# Patient Record
Sex: Male | Born: 1945 | Race: White | Hispanic: No | Marital: Married | Smoking: Former smoker
Health system: Southern US, Community
[De-identification: ages and names within clinical notes are randomized; demographics above are authoritative.]

## PROBLEM LIST (undated history)

## (undated) DIAGNOSIS — R112 Nausea with vomiting, unspecified: Secondary | ICD-10-CM

## (undated) DIAGNOSIS — S060X9A Concussion with loss of consciousness of unspecified duration, initial encounter: Secondary | ICD-10-CM

## (undated) DIAGNOSIS — D649 Anemia, unspecified: Secondary | ICD-10-CM

## (undated) DIAGNOSIS — I2510 Atherosclerotic heart disease of native coronary artery without angina pectoris: Secondary | ICD-10-CM

## (undated) DIAGNOSIS — S060XAA Concussion with loss of consciousness status unknown, initial encounter: Secondary | ICD-10-CM

## (undated) DIAGNOSIS — M5412 Radiculopathy, cervical region: Secondary | ICD-10-CM

## (undated) DIAGNOSIS — E1140 Type 2 diabetes mellitus with diabetic neuropathy, unspecified: Secondary | ICD-10-CM

## (undated) DIAGNOSIS — I10 Essential (primary) hypertension: Secondary | ICD-10-CM

## (undated) DIAGNOSIS — C801 Malignant (primary) neoplasm, unspecified: Secondary | ICD-10-CM

## (undated) DIAGNOSIS — Z87442 Personal history of urinary calculi: Secondary | ICD-10-CM

## (undated) DIAGNOSIS — K56609 Unspecified intestinal obstruction, unspecified as to partial versus complete obstruction: Secondary | ICD-10-CM

## (undated) DIAGNOSIS — N400 Enlarged prostate without lower urinary tract symptoms: Secondary | ICD-10-CM

## (undated) DIAGNOSIS — K259 Gastric ulcer, unspecified as acute or chronic, without hemorrhage or perforation: Secondary | ICD-10-CM

## (undated) DIAGNOSIS — E119 Type 2 diabetes mellitus without complications: Secondary | ICD-10-CM

## (undated) DIAGNOSIS — F4310 Post-traumatic stress disorder, unspecified: Secondary | ICD-10-CM

## (undated) DIAGNOSIS — E785 Hyperlipidemia, unspecified: Secondary | ICD-10-CM

## (undated) HISTORY — PX: JOINT REPLACEMENT: SHX530

## (undated) HISTORY — PX: ABDOMINAL SURGERY: SHX537

## (undated) HISTORY — PX: MENISCUS REPAIR: SHX5179

## (undated) HISTORY — PX: BACK SURGERY: SHX140

## (undated) HISTORY — PX: OTHER SURGICAL HISTORY: SHX169

## (undated) HISTORY — PX: BILATERAL CARPAL TUNNEL RELEASE: SHX6508

## (undated) HISTORY — PX: CARDIAC CATHETERIZATION: SHX172

## (undated) HISTORY — PX: TONSILLECTOMY: SUR1361

## (undated) HISTORY — PX: CORONARY STENT PLACEMENT: SHX6853

---

## 2004-04-23 ENCOUNTER — Inpatient Hospital Stay (HOSPITAL_COMMUNITY): Admission: RE | Admit: 2004-04-23 | Discharge: 2004-04-26 | Payer: Self-pay | Admitting: Orthopedic Surgery

## 2007-08-17 ENCOUNTER — Inpatient Hospital Stay (HOSPITAL_COMMUNITY): Admission: RE | Admit: 2007-08-17 | Discharge: 2007-08-20 | Payer: Self-pay | Admitting: Orthopedic Surgery

## 2010-06-12 NOTE — Op Note (Signed)
NAMEALVIS, EDGELL                ACCOUNT NO.:  [id number]   MEDICAL RECORD NO.:  [id number]          PATIENT TYPE:  INP   LOCATION:  0006                         FACILITY:  [facility]   PHYSICIAN:  Ollen Gross, M.D.    DATE OF BIRTH:  [birth year]   DATE OF PROCEDURE:  08/17/2007  DATE OF DISCHARGE:                               OPERATIVE REPORT   PREOPERATIVE DIAGNOSIS:  Osteoarthritis, right hip.   POSTOPERATIVE DIAGNOSIS:  Osteoarthritis, right hip.   PROCEDURE:  Right total hip arthroplasty.   SURGEON:  Aluisio   ASSISTANT:  Randel Pigg, PA-C   ANESTHESIA:  General.   ESTIMATED BLOOD LOSS:  500.   DRAIN:  Hemovac x 1.   COMPLICATIONS:  None.   CONDITION:  Stable to recovery.   BRIEF CLINICAL NOTE:  [name] is a 65 year old male who has end-stage  osteoarthritis of the right hip with progressively worsening pain and  dysfunction.  He has had a previous successful left total hip  arthroplasty, presents now for right total hip arthroplasty.   PROCEDURE IN DETAIL:  After the successful administration of general  anesthetic, the patient is placed in the left lateral decubitus position  with the right side up and held with the hip positioner.  The right  lower extremity is isolated from his perineum with plastic drapes and  prepped and draped in the usual sterile fashion.  Short posterolateral  incision is made with a 10 blade through subcutaneous tissue to the  level of the fascia lata which is incised in line with the skin  incision.  Sciatic nerve is palpated and protected and the short  external rotators isolated off the femur.  Capsulectomy is performed,  and the hip is dislocated.  Center of the femoral head is marked and a  trial prosthesis placed such that the center of the trial head  corresponds to the center of his native femoral head.  Osteotomy line is  marked on the femoral neck, and osteotomy is made with an oscillating  saw.  Femoral head is them removed, and the femur  is retracted  anteriorly to gain acetabular exposure.   Acetabular retractors are placed and labrum and osteophytes removed.  Reaming starts at 47 mm, coursing in increments of 2 to 53 mm.  Then a  54 mm Pinnacle acetabular shell is placed in anatomic position and  transfixed with 2 dome screws.  The apex hole eliminator is then placed,  and the permanent 36 mm neutral Ultamet metal liner is placed for metal-  on-metal hip replacement.   Femur is prepared with the canal finder and irrigation.  Axial remaining  is performed to 13.5 mm, proximal reaming to an 18D, and the sleeve  machined to a small.  An 18D small trial sleeve is placed with an 18 x  13 stem, a 36 plus 8 neck, matching native anteversion.  A 36 plus head  is placed.  With the plus head, there was too much offset, so we  impacted a 36 standard neck, matching native anteversion.  With the 36  plus 0 head, and the  hip is reduced with excellent stability.  There was  full extension, full external rotation, 70 degrees flexion, 40 degrees  adduction, 90 degrees internal rotation, and 90 degrees of flexion, 70  degrees of internal rotation.  By placing the right leg on top of the  left, it is felt as though leg lengths are equal.  The hip is then  dislocated and trials removed.  Permanent 18D small sleeve is placed, an  18 x 13 stem, and a 36 standard neck, matching native anteversion.  Then  36 plus 0 head is placed, and the his is reduced with the same stability  parameters.  The wound is copiously irrigated with saline solution, the  short rotators reattached to the femur through drill holes.  Fascia lata  is closed over a Hemovac drain with interrupted #1 Vicryl, subcu closed  with #1 and 2-0 Vicryl, and subcuticular with running 4-0 Monocryl.  The  incision is cleaned and dried and Steri-Strips and a bulky sterile  dressing applied. The drain is hooked to suction, and is placed into a  knee immobilizer, awakened, and  transported to recovery in stable  condition.      Ollen Gross, M.D.  Electronically Signed     FA/MEDQ  D:  08/17/2007  T:  08/17/2007  Job:  [id number]

## 2010-06-15 NOTE — H&P (Signed)
[name], [name]                ACCOUNT NO.:  [id number]   MEDICAL RECORD NO.:  [id number]          PATIENT TYPE:  INP   LOCATION:  NA                           FACILITY:  [facility]   PHYSICIAN:  Ollen Gross, M.D.    DATE OF BIRTH:  [birth year]   DATE OF ADMISSION:  04/23/2004  DATE OF DISCHARGE:                                HISTORY & PHYSICAL   CHIEF COMPLAINT:  Left hip pain.   HISTORY OF PRESENT ILLNESS:  Mr. Spitler is a 65 year old male with a 6-8  month progressive history of left hip pain.  It started in the hip area and  radiated down towards the knee.  He injured himself about a year and a half  ago in [place] while he was fishing and standing on a rock.  He had a lot of  swelling laterally at the time, but he was told that he did not have any  fractures.  He followed up with Dr. Thamas Jaegers at [facility] last  summer and was told that he had severe arthritis and needed a hip  replacement.  His symptoms have been getting progressively worse.  He has  constant pain, even occurring at night.  His x-ray shows severe joint space  narrowing of the left hip with bone-on-bone changes.  It is felt he would  benefit from surgical intervention.  Risks and benefits discussed.  Patient  was subsequently admitted to the hospital.   ALLERGIES:  No known drug allergies.   CURRENT MEDICATIONS:  1.  Lisinopril 2 mg daily.  2.  Felodipine 5 mg daily.  3.  Darvocet as needed.   PAST MEDICAL HISTORY:  1.  Hypertension.  2.  History of gastric ulcers.  3.  History of renal calculi.  4.  History of wrist fracture.   PAST SURGICAL HISTORY:  1.  Surgery for a head injury in [place] in 1970.  2.  Gastric surgery for ulcers in 1990.  3.  Back surgery, diskectomy in 1995.  4.  Knee surgery in 2003.  5.  Kidney stone surgery in 2005.  6.  Left carpal tunnel in 2000.   SOCIAL HISTORY:  Married.  Works in Air[employer].  Nonsmoker.  No alcohol.  Has one  daughter.  His wife will be assisting  with care after surgery.   FAMILY HISTORY:  Father living at age 59 with heart disease, hypertension,  and arthritis.  Mother living at age 75 with a history of hypertension and  stroke.   REVIEW OF SYSTEMS:  GENERAL:  No fevers, chills, night sweats.  NEURO:  No  seizures, syncope, paralysis.  RESPIRATORY:  No shortness of breath,  productive cough, or hemoptysis.  CARDIOVASCULAR:  No chest pain, angina,  orthopnea.  GI:  No nausea, vomiting, diarrhea, or constipation.  GU:  No  dysuria, hematuria, or discharge.  MUSCULOSKELETAL:  Left hip, from the  history of present illness.   PHYSICAL EXAMINATION:  VITAL SIGNS:  Pulse 100, respirations 16, blood  pressure 108/64.  GENERAL:  A 65 year old white male who is well-developed and well-nourished  in  no acute distress.  Alert, oriented and cooperative.  Pleasant.  Accompanied by his wife.  HEENT:  Normocephalic and atraumatic.  Pupils are round and reactive.  Oropharynx is clear.  EOMs are intact.  He does wear reading glasses.  NECK:  Supple.  CHEST:  Clear anterior and posterior chest wall.  No rales, rhonchi or  wheezes.  HEART:  Regular rate and rhythm without murmur.  S1 and S2 noted.  ABDOMEN:  Soft, nontender.  Bowel sounds present.  RECTAL/BREASTS/GENITALIA:  Not done.  Not pertinent to the present illness.  EXTREMITIES:  Left hip:  The left hip shows flexion to 90 degrees.  There is  no internal rotation.  External rotation of about 5 degrees.  Abduction of  10.  He does ambulate with an antalgic gait.   IMPRESSION:  1.  Osteoarthritis, left hip.  2.  Hypertension.  3.  Past history of gastric ulcers.  4.  History of renal calculi.   PLAN:  Patient admitted to [facility] to undergo left total hip  arthroplasty.  Surgery will be performed by Dr. Trudee Grip.      ALP/MEDQ  D:  04/22/2004  T:  04/22/2004  Job:  [id number]   cc:   Dr. Farrel Gobble, [place]

## 2010-06-15 NOTE — Op Note (Signed)
NAMEAMARION, PORTELL                ACCOUNT NO.:  [id number]   MEDICAL RECORD NO.:  [id number]          PATIENT TYPE:  INP   LOCATION:  X003                         FACILITY:  [facility]   PHYSICIAN:  Ollen Gross, M.D.    DATE OF BIRTH:  [birth year]   DATE OF PROCEDURE:  04/23/2004  DATE OF DISCHARGE:                                 OPERATIVE REPORT   PREOPERATIVE DIAGNOSIS:  Osteoarthritis, left hip.   POSTOPERATIVE DIAGNOSIS:  Osteoarthritis, left hip.   PROCEDURE:  Left total hip arthroplasty.   SURGEON:  Ollen Gross, M.D.   ASSISTANT:  Avel Peace, PA-C.   ANESTHESIA:  General.   ESTIMATED BLOOD LOSS:  800 cc.   DRAINS:  Hemovac x1.   COMPLICATIONS:  None.   CONDITION:  Stable to the recovery room.   CLINICAL NOTE:  Mr. Klippel is a 65 year old male who has severe end-stage  arthritis of the left hip with intractable pain.  He has failed nonoperative  management and presents now for left total hip arthroplasty.   PROCEDURE IN DETAIL:  After successful administration of general anesthetic,  the patient was placed in the right lateral decubitus position with the left  side up and held with a hip positioner.  The left lower extremity was  isolated from his perineum with plastic drapes and prepped and draped in the  usual sterile fashion.  A short posterolateral incision was made with a 10  blade through subcutaneous tissue to the level of the fascia lata, which was  incised in line with the skin incision.  The sciatic nerve was palpated and  protected.  Short rotators were isolated off the femur.  A capsulectomy was  performed, and the hip was dislocated.  The center of the femoral head is  marked, and a trial prosthesis is placed such that the center of the trial  head corresponds to the center of his native femoral head.  Osteotomy line  is marked on the femoral neck, and osteotomy is made with an oscillating  saw.  The femoral head is then removed, and the femur  retracted anteriorly  to gain acetabular exposure.   The acetabular labrum and osteophytes are removed.  He had a large inferior  osteophyte which he has excised.  Reaming starts at 45, coursing increments  up 2 to a 53, then a 54 mm Pinnacle acetabular shell is placed in anatomic  position and transfixed with two dome screws.  A trial 36 mm neutral liner  is placed.   The femur is prepared, first with the canal finder, then the irrigation.  Axial reaming is performed up to 13.5 mm proximal, reaming to an 18B and the  sleeve machine to a large.  An 18B large trial sleeve is placed, and an 18 x  13 stem and a 36+8 neck.  We matched native anteversion.  A 36 to a 0 head  is placed, and the hip is reduced with excellent stability, full extension,  external rotation, 70 degrees flexion , 40 degrees adduction, 90 degrees  internal rotation, 90 degrees flexion, 70 degrees internal rotation.  The  trials are then removed, and a permanent apex hole eliminator is placed to  the acetabular shell.  The permanent 36 mm neutral Ultramet metal liner is  placed.  It is a metal-on-metal hip replacement.  The permanent 18B large  sleeve is placed.  The large, while seating well in the trial, was not  seating well with the permanent implant.  It was seating too proud and would  have lengthened the patient's leg.  We thus went down to an 18B small sleeve  and impacted that completely.  I then freshened up the femoral neck cut to  make it level with the 18B small sleeve.  The 18 x 13 stem with a 36+8 neck  is then placed, matching native anteversion.  A 36+0 head is placed, and the  hip is reduced with the same stability parameters.  I placed the left leg on  top of the right.  The leg lengths are found to be equal.  The wound is  copiously irrigated with saline solution, then the short rotators are  reattached to the femurs through drill holes.  The fascia lata is closed  over a Hemovac drain with  interrupted #1 Vicryl.  The subcu closed with #1  and 2-0 Vicryl and subcuticular running 4-0 Monocryl.  The incision is  cleaned and dried, and Steri-Strip and a bulky sterile dressing applied.  The drain is hooked to suction.  He is then awakened and transported to  recovery in stable condition.      FA/MEDQ  D:  04/23/2004  T:  04/23/2004  Job:  [id number]

## 2010-06-15 NOTE — Discharge Summary (Signed)
NAMEGOTTLIEB, ZUERCHER                ACCOUNT NO.:  [id number]   MEDICAL RECORD NO.:  [id number]          PATIENT TYPE:  INP   LOCATION:  1618                         FACILITY:  [facility]   PHYSICIAN:  Ollen Gross, M.D.    DATE OF BIRTH:  04-23-1945   DATE OF ADMISSION:  08/17/2007  DATE OF DISCHARGE:  08/20/2007                               DISCHARGE SUMMARY   ADMISSION DIAGNOSES:  1. Osteoarthritis, right hip.  2. Hypertension.  3. History of ulcerations.  4. Prior history of gastric ulcers.  5. Radial calculi.  6. Arthritis.   DISCHARGE DIAGNOSES:  1. Osteoarthritis, right hip, status post right total hip replacement      arthroplasty.  2. Hypertension.  3. History of ulcerations.  4. Prior history of gastric ulcers.  5. Radial calculi.  6. Arthritis.   PROCEDURE:  On August 17, 2007, right total hip.   SURGEON:  Ollen Gross, M.D.   ASSISTANT:  Jamelle Rushing, P.A.   ANESTHESIA:  General.   CONSULTS:  None.   BRIEF HISTORY:  Anil is a 65 year old male with severe end-stage arthritis of the  right hip with progressively worsening pain and dysfunction, previous  successful left total, who now presents for right total hip.   LABORATORY DATA:  CBC preop:  Hemoglobin 14.4, hematocrit 42.6, white cell count 6.5.  Chem panel within normal limits.  Serial CBCs were followed.  Hemoglobin  dropped down to 11 and 10.6.  Last-noted H&H stabilized at 10.;6 with a  crit of 31.2.  PT/PTT preop 13.3 and 27, respectively.  INR 1.  Pro  times followed.  Last-noted PT/INR 23.9 and 2.  Serial BMETs were  followed.  Electrolytes remained within normal limits.  Preop UA was  negative.  Blood group type O negative.   Right hip film on August 12, 2007:  Progressive degenerative joint  disease, right hip.   Two view chest on August 12, 2007:  No acute infiltrate or pleural  effusion.  Elevation of right hemidiaphragm.   EKG dated August 10, 2007:  Sinus rhythm.  Normal EKG confirmed.   This was  sent over from  [facility].   HOSPITAL COURSE:  Patient was admitted to [facility], taken to the OR and  underwent the above-stated procedure without complication.  Tolerated  the procedure well, and was later transferred to the recovery room and  then to the orthopedic floor.  Started on PCA and p.o. analgesics for  pain control following surgery, given 24-hour postoperative antibiotics.  Actually doing very well on the morning of day #1.  Blood pressure ran a  little low over the evening of surgery and was low on the morning of day  #1, so we reduced some of the blood pressure medications and put some on  hold with parameters.  Hemoglobin of 7 and 11.2.  Started back on the  omeprazole with his history of ulcerations.  Got up and walked a few  feet, about 4-5 steps.  By the following day, however, patient was doing  very well.  Hemoglobin was 10.7,  asymptomatic with this.  Walking about  15 feet that morning.  Doing a little bit better with pain control and  weaned up to p.o. medications.  The PCA was discontinued on the evening  of day #1.  Foley was out.  Dressing change:  Incision looked good.  Continued to progress well.   By postop day #3, patient was doing well, progressing with therapy,  tolerating meds and discharged home.   DISCHARGE PLAN:  1. Patient was discharged home on August 20, 2007.  2. Discharge diagnoses:  Please see above.  3. Discharge meds:  Vicodin, Coumadin, Robaxin.  4. Diet:  As tolerated.  5. Activity:  Partial weightbearing, 25-50%.  Home health PT/OT and      home health nursing.  Total hip protocol.  6. Follow up two weeks from surgery.   DISPOSITION:  Home.   CONDITION ON DISCHARGE:  Improving.     Alexzandrew L. Perkins, P.A.C.      Ollen Gross, M.D.  Electronically Signed   ALP/MEDQ  D:  10/07/2007  T:  10/07/2007  Job:  [id number]   cc:   Dr. Lossie Faes

## 2010-06-15 NOTE — H&P (Signed)
NAMEGERAL, [name]                ACCOUNT NO.:  [id number]   MEDICAL RECORD NO.:  [id number]          PATIENT TYPE:  INP   LOCATION:  1618                         [facility]ACILITY:  [facility]   PHYSICIAN:  Ollen Gross, M.D.    DATE OF BIRTH:  [birth year]   DATE OF ADMISSION:  08/17/2007  DATE OF DISCHARGE:  08/20/2007                              HISTORY & PHYSICAL   CHIEF COMPLAINT:  Right hip pain.   HISTORY OF PRESENT ILLNESS:  Patient is a 65 year old male who has been  seen by Dr. Lequita Halt for ongoing right hip pain.  It has been ongoing for  quite some time now.  The patient is found have pretty significant bone-  on-bone arthritis.  He has had a previous total hip on the left about 4  years ago, doing well with that and now presents for the right hip.   ALLERGIES:  None.   CURRENT MEDICATIONS:  Amlodipine, omeprazole, lisinopril, metoprolol,  niacin, baby aspirin, glipizide.   PAST MEDICAL HISTORY:  1. Hypertension.  2. History of ulcerations.  3. Prior history of gastric ulcers.  4. Renal calculi.  5. Arthritis.   PAST SURGICAL HISTORY:  1. Disc surgery.  2. Carpal tunnel surgery.  3. Kidney stone surgery.  4. Hip replacement.  5. Stomach surgery for ulcers.  6. Knee surgery.   SOCIAL HISTORY:  Married, works in Airline pilot, past smoker.  No alcohol.  One  child.  Family will be assisting with care after surgery.   FAMILY HISTORY:  Father with heart disease and aortic aneurysm.  Mother  with lung cancer.   REVIEW OF SYSTEMS:  [name]:  No fevers, chills, or night sweats.  NEURO:  No seizures, syncope or paralysis.  RESPIRATORY: No shortness  breath, productive cough or hemoptysis.  CARDIOVASCULAR: No chest pain,  angina or orthopnea.  GI:  A little bit of diarrhea secondary to gastric  surgery.  No nausea or vomiting.  GU: No dysuria, hematuria or  discharge.  MUSCULOSKELETAL:  Joint pain.   PHYSICAL EXAMINATION:  VITAL SIGNS:  Pulse 74, respirations 14, blood  pressure  160/60.  [name]:  A 65 year old white male, well-nourished, well-developed, in  no acute distress, alert and cooperative.  Good historian.  HEENT:  Normocephalic, atraumatic.  Pupils round and reactive.  Oropharynx clear.  EOMs intact.  NECK:  Supple.  CHEST: Clear.  HEART:  Regular rate and rhythm.  No murmur.  ABDOMEN:  Soft, nontender.  Bowel sounds present.  RECTAL, BREASTS, GENITALIA:  Not done, not pertinent to present illness.  EXTREMITIES:  Right hip flexion 90, 0 internal rotation, 20 degrees  external rotation, 20 degrees abduction.   IMPRESSION:  Osteoarthritis right.   PLAN:  The patient admitted to [facility] to undergo a right  total replacement arthroplasty.  Surgery will be performed by Ollen Gross.      Alexzandrew L. Perkins, P.A.C.      Ollen Gross, M.D.  Electronically Signed    ALP/MEDQ  D:  08/23/2007  T:  08/23/2007  Job:  [id number]   cc:   Dr. Leonette Most

## 2010-06-15 NOTE — Discharge Summary (Signed)
[name], [name]                ACCOUNT NO.:  [id number]   MEDICAL RECORD NO.:  [id number]          PATIENT TYPE:  INP   LOCATION:  0469                         [facility]ACILITY:  [facility]   PHYSICIAN:  Ollen Gross, M.D.    DATE OF BIRTH:  [birth year]   DATE OF ADMISSION:  04/23/2004  DATE OF DISCHARGE:                                 DISCHARGE SUMMARY   ADMITTING DIAGNOSES:  1.  Osteoarthritis left hip.  2.  Hypertension.  3.  Past history of gastric ulcers.  4.  History of renal calculi.   DISCHARGE DIAGNOSIS:  1.  Osteoarthritis left hip status post left total hip arthroplasty.  2.  Hypertension.  3.  Past history of gastric ulcers.  4.  History of renal calculi.   PROCEDURE:  April 23, 2004 - left total hip. Surgeon:  Dr. Lequita Halt.  Assistant:  Avel Peace, P.A.-C. Anesthesia:  General. Blood loss 800 mL.  Hemovac drain x1.   CONSULTS:  None.   BRIEF HISTORY:  [name] is a 65 year old male with severe end-stage  arthritis of the left hip with intractable pain. He has failed nonoperative  management and now presents for total hip arthroplasty.   LABORATORY DATA:  Preoperative CBC showed hemoglobin of 15.6, hematocrit of  46.3, white count of 5.8, normal differential. Postoperative hemoglobin  10.9. Last noted H&H 9.9 and 28.9. PT/PTT preoperative 11.9 and 26  respectively and INR 0.9. Serial protimes followed. Last noted PT/INR 18.5  and 1.9. Chem panel on admission all within normal limits. Follow-up BMET:  Drop in sodium from 143 to 133, back up to 136. Remaining electrolytes  remained within normal limits. Urinalysis preoperatively negative. Blood  group type O negative. X-rays:  Pelvis and hip film on April 23, 2004:  Status post left total hip arthroplasty with anatomic alignment.   Chest x-ray dated April 17, 2004:  No acute disease. Preoperative hip films  dated April 17, 2004:  Osteoarthritis hips bilaterally, much worse on the  left.   EKG dated April 17, 2004:   Normal sinus rhythm, normal EKG, unconfirmed.   HOSPITAL COURSE:  Admitted to the hospital, taken to the OR, underwent the  above procedure, tolerated well. Later transferred to the recovery room and  then to the orthopedic floor. Started on PCA and p.o. analgesia for pain  control following surgery. On day #1 he was doing much better. He could  already tell that his deep pain was improved. Hemovac drain was pulled  without difficulty. He was started on his total hip protocol with PT and OT  for gait training ambulation and ADLs. By day #2 he was already up moving  around much better, was tolerating p.o. medications. PCA and IVs were  discontinued. He had a little bit of blister noted when the dressing was  changed. Incision was healing well. Tegaderm was added for the covering for  the blister. From a therapy standpoint, he did very well. He was ambulating  70 feet and 80 feet on day #2. By day #3 he was feeling much better. Seen on  rounds by Dr.  Aluisio and discharged home.   DISCHARGE MEDICATIONS AND PLAN:  1.  The patient discharged home on April 26, 2004.  2.  Discharge diagnoses:  Same as above.  3.  Discharge medications:  Coumadin, Percocet, Robaxin.  4.  Diet as tolerated.  5.  Follow-up 2 weeks.  6.  Activity:  Partial weightbearing left lower extremity 25-50% partial      weightbearing. Home health PT, home health nursing. Hip precautions at      all times.   DISPOSITION:  Home.   CONDITION UPON DISCHARGE:  Improved.      ALP/MEDQ  D:  04/26/2004  T:  04/26/2004  Job:  [id number]   cc:   Dr. Leonette Most in [place], [place].

## 2010-10-26 LAB — CBC
HCT: 31.2 — ABNORMAL LOW
HCT: 31.3 — ABNORMAL LOW
Hemoglobin: 10.6 — ABNORMAL LOW
Hemoglobin: 10.6 — ABNORMAL LOW
MCHC: 33.5
MCV: 88.4
MCV: 89.3
Platelets: 124 — ABNORMAL LOW
Platelets: 130 — ABNORMAL LOW
Platelets: 156
RBC: 3.52 — ABNORMAL LOW
RBC: 3.73 — ABNORMAL LOW
RDW: 13.2
RDW: 13.4
WBC: 6.7
WBC: 7.4
WBC: 8.4

## 2010-10-26 LAB — URINALYSIS, ROUTINE W REFLEX MICROSCOPIC
Hgb urine dipstick: NEGATIVE
Ketones, ur: NEGATIVE
Nitrite: NEGATIVE
Protein, ur: NEGATIVE
Specific Gravity, Urine: 1.024
Urobilinogen, UA: 1
pH: 5.5

## 2010-10-26 LAB — APTT: aPTT: 27

## 2010-10-26 LAB — GLUCOSE, CAPILLARY
Glucose-Capillary: 105 — ABNORMAL HIGH
Glucose-Capillary: 132 — ABNORMAL HIGH
Glucose-Capillary: 86
Glucose-Capillary: 94
Glucose-Capillary: 98

## 2010-10-26 LAB — BASIC METABOLIC PANEL
BUN: 12
BUN: 16
Calcium: 8 — ABNORMAL LOW
Calcium: 8.2 — ABNORMAL LOW
Creatinine, Ser: 1.24
GFR calc non Af Amer: 59 — ABNORMAL LOW
GFR calc non Af Amer: >60
Glucose, Bld: 136 — ABNORMAL HIGH
Sodium: 143

## 2010-10-26 LAB — PROTIME-INR
INR: 1.2
Prothrombin Time: 15.3 — ABNORMAL HIGH

## 2010-10-26 LAB — TYPE AND SCREEN: Antibody Screen: NEGATIVE

## 2017-07-25 ENCOUNTER — Ambulatory Visit: Payer: Self-pay | Admitting: Surgery

## 2017-07-25 NOTE — H&P (Signed)
CC: [facility] [place] [facility] orifice polyp  HPI: Mr. Cooprider is a very pleasant 37yoM with hx of HTN, DM, CAD (s/p stent 2wks ago on plavix) presents to our office as a referral from [facility] for evaluation of an appendiceal orifice polyp. He underwent a diagnostic colonoscopy for diarrhea 04/30/17 and was found to have diverticulosis in the sigmoid and left-sided colon, appendiceal orifice villous adenoma polyp that was biopsied but not removed, normal-appearing terminal ileum, 2 small ascending polyps removed, random colon biopsies, and hemorrhoids on retroflexion. He denies any complaints today. He denies any abdominal pain, fever/chills, nausea/vomiting.  Pathology returned: -Appendiceal orifice polyp - bx - tubular adenoma -TI bx - lymphoid aggregates -Random colon biopsies lymphoid aggregates -Descending colon polyps-tubular adenomas  He did just have a stent placed 2 weeks ago by his cardiologist, Dr.Wahid in [place] and is on plavix.  PMH: HTN (well-controlled on oral antihypertensive), DM (on metformin, reports well controlled), CAD (s/p stent 2wks ago on Plavix). His cardiologist is Dr. Gerarda Gunther in [place]  [place]: Exlap/partial gastrectomy for PUD 1993; bilateral hip replacements, back surgery, left shoulder rotator cuff surgery, bilateral carpal tunnel release, left knee surgery, lithotripsy for kidney stones  FHx: Denies FHx of malignancy  Social: Denies use of tobacco/EtOH/drugs. He is a retired Hotel manager.  ROS: A comprehensive 10 system review of systems was completed with the patient and pertinent findings as noted above.  The patient is a 72 year old male.   Past Surgical History Sabino Gasser; 07/25/2017 2:07 PM) Colon Polyp Removal - Colonoscopy  Hip Surgery  Bilateral. Shoulder Surgery  Left. Spinal Surgery - Lower Back   Diagnostic Studies History Sabino Gasser; 07/25/2017 2:07 PM) Colonoscopy  within last  year  Allergies Sabino Gasser; 07/25/2017 2:08 PM) No Known Drug Allergies [07/25/2017]: Allergies Reconciled   Medication History Sabino Gasser; 07/25/2017 2:13 PM) Carvedilol (6.25MG  Tablet, Oral) Active. Clopidogrel Bisulfate (75MG  Tablet, Oral) Active. Nitroglycerin (0.4MG  Tab Sublingual, Sublingual) Active. Tobramycin (0.3% Solution, Ophthalmic) Active. Amlodipine Besy-Benazepril HCl (10-20MG  Capsule, Oral) Active. DULoxetine HCl (30MG  Capsule DR Part, Oral) Active. Lisinopril (20MG  Tablet, Oral) Active. MetFORMIN HCl (500MG  Tablet, Oral) Active. Omeprazole Magnesium (20MG  Tablet DR, Oral) Active. Tamsulosin HCl (0.4MG  Capsule, Oral) Active. Medications Reconciled  Social History Sabino Gasser; 07/25/2017 2:07 PM) Alcohol use  Occasional alcohol use. Caffeine use  Coffee, Tea. No drug use  Tobacco use  Former smoker.  Family History Sabino Gasser; 07/25/2017 2:07 PM) Arthritis  Father. Cerebrovascular Accident  Mother. Heart Disease  Father. Hypertension  Father.  Other Problems Sabino Gasser; 07/25/2017 2:07 PM) Anxiety Disorder  Arthritis  Back Pain  Diabetes Mellitus  Gastric Ulcer  High blood pressure  Kidney Stone     Review of Systems Sabino Gasser; 07/25/2017 2:07 PM) General Not Present- Appetite Loss, Chills, Fatigue, Fever, Night Sweats, Weight Gain and Weight Loss. Skin Not Present- Change in Wart/Mole, Dryness, Hives, Jaundice, New Lesions, Non-Healing Wounds, Rash and Ulcer. HEENT Present- Hearing Loss, Ringing in the Ears and Wears glasses/contact lenses. Not Present- Earache, Hoarseness, Nose Bleed, Oral Ulcers, Seasonal Allergies, Sinus Pain, Sore Throat, Visual Disturbances and Yellow Eyes. Respiratory Not Present- Bloody sputum, Chronic Cough, Difficulty Breathing, Snoring and Wheezing. Breast Not Present- Breast Mass, Breast Pain, Nipple Discharge and Skin Changes. Cardiovascular Not Present- Chest Pain, Difficulty  Breathing Lying Down, Leg Cramps, Palpitations, Rapid Heart Rate, Shortness of Breath and Swelling of Extremities. Gastrointestinal Present- Chronic diarrhea. Not Present- Abdominal Pain, Bloating, Bloody Stool, Change in Bowel Habits,  Constipation, Difficulty Swallowing, Excessive gas, Gets full quickly at meals, Hemorrhoids, Indigestion, Nausea, Rectal Pain and Vomiting. Male Genitourinary Present- Frequency and Nocturia. Not Present- Blood in Urine, Change in Urinary Stream, Impotence, Painful Urination, Urgency and Urine Leakage. Musculoskeletal Present- Back Pain, Joint Stiffness and Muscle Pain. Not Present- Joint Pain, Muscle Weakness and Swelling of Extremities. Neurological Not Present- Decreased Memory, Fainting, Headaches, Numbness, Seizures, Tingling, Tremor, Trouble walking and Weakness. Psychiatric Present- Anxiety. Not Present- Bipolar, Change in Sleep Pattern, Depression, Fearful and Frequent crying. Endocrine Not Present- Cold Intolerance, Excessive Hunger, Hair Changes, Heat Intolerance, Hot flashes and New Diabetes. Hematology Present- Blood Thinners. Not Present- Easy Bruising, Excessive bleeding, Gland problems, HIV and Persistent Infections.  Vitals Sabino Gasser; 07/25/2017 2:13 PM) 07/25/2017 2:12 PM Weight: 187.5 lb Height: 70in Body Surface Area: 2.03 m Body Mass Index: 26.9 kg/m  Temp.: 98.59F(Oral)  Pulse: 80 (Regular)  BP: 126/80 (Sitting, Left Arm, Standard)       Physical Exam Harrell Gave M. Burns Timson MD; 07/25/2017 2:38 PM) The physical exam findings are as follows: Note:Constitutional: No acute distress; conversant; no deformities Eyes: Moist conjunctiva; no lid lag; anicteric sclerae; pupils equal round and reactive to light Neck: Trachea midline; no palpable thyromegaly Lungs: Normal respiratory effort; no tactile fremitus CV: Regular rate and rhythm; no palpable thrill; no pitting edema GI: Abdomen soft, nontender, nondistended; no palpable  hepatosplenomegaly. Upper midline scar consistent with stated history MSK: Normal gait; no clubbing/cyanosis Psychiatric: Appropriate affect; alert and oriented 3 Lymphatic: No palpable cervical or axillary lymphadenopathy    Assessment & Plan Harrell Gave M. Babe Clenney MD; 07/25/2017 2:50 PM) COLON POLYP (K63.5) Story: Mr. Gregori is a very pleasant 20yoM with hx HTN, DM, CAD (s/p stent 2wks ago, on Plavix) here today with endoscopically unresectable polyp found the appendiceal orifice - bx of this returned tubular adenoma Impression: -We requesting cardiac evaluation and clearance as well as a plan regarding his Plavix given a recent stent placement -The anatomy and physiology of the GI tract was discussed at length with the patient. The pathophysiology of colon polyps and cancer was discussed at length with associated pictures. We discussed approaches for management of this. Operative approaches include a appendectomy/wedge septectomy with intraoperative guidance using colonoscopy. We also discussed nonoperative management which would include observation but the risk of this is that this harbored malignancy or will progress to one with subsequent appendicitis and/or dissemination of cancer. He opted to pursue surgery. We discussed the potential need for a right hemicolectomy following appendectomy if the final pathology showed this to be an invasive cancer. We also discussed the potential for an ileocolic resection if this is not removable with an appendectomy alone at the time of his planned appendectomy after the specimen is examined in the operating room. -The planned procedure, material risks (including, but not limited to, pain, bleeding, infection, scarring, need for blood transfusion, damage to surrounding structures- blood vessels/nerves/viscus/organs, damage to ureter, urine leak, leak from anastomosis, need for additional procedures, need for stoma which may be permanent, hernia, recurrence,  pneumonia, heart attack, stroke, death) benefits and alternatives to surgery were discussed at length. The patient's questions were answered to his satisfaction, he voiced understanding and elected to proceed with surgery. Additionally, we discussed typical postoperative expectations and the recovery process.  Signed electronically by Ileana Roup, MD (07/25/2017 2:51 PM)

## 2018-01-06 ENCOUNTER — Encounter (HOSPITAL_COMMUNITY): Payer: Self-pay

## 2018-01-06 ENCOUNTER — Emergency Department (HOSPITAL_COMMUNITY): Payer: Medicare HMO

## 2018-01-06 ENCOUNTER — Inpatient Hospital Stay (HOSPITAL_COMMUNITY)
Admission: EM | Admit: 2018-01-06 | Discharge: 2018-01-19 | DRG: 330 | Disposition: A | Discharge status: Home / Self Care | Payer: Medicare HMO | Attending: Internal Medicine | Admitting: Internal Medicine

## 2018-01-06 ENCOUNTER — Other Ambulatory Visit: Payer: Self-pay

## 2018-01-06 DIAGNOSIS — Z23 Encounter for immunization: Secondary | ICD-10-CM | POA: Diagnosis present

## 2018-01-06 DIAGNOSIS — M542 Cervicalgia: Secondary | ICD-10-CM | POA: Diagnosis present

## 2018-01-06 DIAGNOSIS — K5669 Other partial intestinal obstruction: Principal | ICD-10-CM | POA: Diagnosis present

## 2018-01-06 DIAGNOSIS — M25512 Pain in left shoulder: Secondary | ICD-10-CM | POA: Diagnosis not present

## 2018-01-06 DIAGNOSIS — E860 Dehydration: Secondary | ICD-10-CM

## 2018-01-06 DIAGNOSIS — Z903 Acquired absence of stomach [part of]: Secondary | ICD-10-CM

## 2018-01-06 DIAGNOSIS — Z960 Presence of urogenital implants: Secondary | ICD-10-CM | POA: Diagnosis not present

## 2018-01-06 DIAGNOSIS — N189 Chronic kidney disease, unspecified: Secondary | ICD-10-CM | POA: Diagnosis present

## 2018-01-06 DIAGNOSIS — K660 Peritoneal adhesions (postprocedural) (postinfection): Secondary | ICD-10-CM | POA: Diagnosis present

## 2018-01-06 DIAGNOSIS — D3502 Benign neoplasm of left adrenal gland: Secondary | ICD-10-CM | POA: Diagnosis present

## 2018-01-06 DIAGNOSIS — C181 Malignant neoplasm of appendix: Secondary | ICD-10-CM | POA: Diagnosis not present

## 2018-01-06 DIAGNOSIS — I129 Hypertensive chronic kidney disease with stage 1 through stage 4 chronic kidney disease, or unspecified chronic kidney disease: Secondary | ICD-10-CM | POA: Diagnosis present

## 2018-01-06 DIAGNOSIS — K56600 Partial intestinal obstruction, unspecified as to cause: Secondary | ICD-10-CM | POA: Diagnosis present

## 2018-01-06 DIAGNOSIS — Z91040 Latex allergy status: Secondary | ICD-10-CM | POA: Diagnosis not present

## 2018-01-06 DIAGNOSIS — Z9049 Acquired absence of other specified parts of digestive tract: Secondary | ICD-10-CM | POA: Diagnosis not present

## 2018-01-06 DIAGNOSIS — Z955 Presence of coronary angioplasty implant and graft: Secondary | ICD-10-CM | POA: Diagnosis not present

## 2018-01-06 DIAGNOSIS — N179 Acute kidney failure, unspecified: Secondary | ICD-10-CM | POA: Diagnosis present

## 2018-01-06 DIAGNOSIS — Z7902 Long term (current) use of antithrombotics/antiplatelets: Secondary | ICD-10-CM

## 2018-01-06 DIAGNOSIS — G8929 Other chronic pain: Secondary | ICD-10-CM | POA: Diagnosis not present

## 2018-01-06 DIAGNOSIS — Z01810 Encounter for preprocedural cardiovascular examination: Secondary | ICD-10-CM

## 2018-01-06 DIAGNOSIS — I25810 Atherosclerosis of coronary artery bypass graft(s) without angina pectoris: Secondary | ICD-10-CM | POA: Diagnosis present

## 2018-01-06 DIAGNOSIS — K5730 Diverticulosis of large intestine without perforation or abscess without bleeding: Secondary | ICD-10-CM | POA: Diagnosis present

## 2018-01-06 DIAGNOSIS — K56609 Unspecified intestinal obstruction, unspecified as to partial versus complete obstruction: Secondary | ICD-10-CM | POA: Diagnosis present

## 2018-01-06 DIAGNOSIS — I2510 Atherosclerotic heart disease of native coronary artery without angina pectoris: Secondary | ICD-10-CM | POA: Diagnosis not present

## 2018-01-06 DIAGNOSIS — K921 Melena: Secondary | ICD-10-CM | POA: Diagnosis not present

## 2018-01-06 DIAGNOSIS — Z933 Colostomy status: Secondary | ICD-10-CM | POA: Diagnosis not present

## 2018-01-06 DIAGNOSIS — I10 Essential (primary) hypertension: Secondary | ICD-10-CM | POA: Diagnosis not present

## 2018-01-06 DIAGNOSIS — Z7982 Long term (current) use of aspirin: Secondary | ICD-10-CM | POA: Diagnosis not present

## 2018-01-06 DIAGNOSIS — Z8711 Personal history of peptic ulcer disease: Secondary | ICD-10-CM

## 2018-01-06 DIAGNOSIS — G589 Mononeuropathy, unspecified: Secondary | ICD-10-CM | POA: Diagnosis not present

## 2018-01-06 DIAGNOSIS — D121 Benign neoplasm of appendix: Secondary | ICD-10-CM | POA: Diagnosis not present

## 2018-01-06 DIAGNOSIS — Z8249 Family history of ischemic heart disease and other diseases of the circulatory system: Secondary | ICD-10-CM | POA: Diagnosis not present

## 2018-01-06 DIAGNOSIS — E785 Hyperlipidemia, unspecified: Secondary | ICD-10-CM | POA: Diagnosis present

## 2018-01-06 DIAGNOSIS — E1122 Type 2 diabetes mellitus with diabetic chronic kidney disease: Secondary | ICD-10-CM | POA: Diagnosis present

## 2018-01-06 DIAGNOSIS — Z79899 Other long term (current) drug therapy: Secondary | ICD-10-CM | POA: Diagnosis not present

## 2018-01-06 DIAGNOSIS — R55 Syncope and collapse: Secondary | ICD-10-CM | POA: Diagnosis present

## 2018-01-06 DIAGNOSIS — Z8739 Personal history of other diseases of the musculoskeletal system and connective tissue: Secondary | ICD-10-CM | POA: Diagnosis not present

## 2018-01-06 DIAGNOSIS — Z932 Ileostomy status: Secondary | ICD-10-CM | POA: Diagnosis not present

## 2018-01-06 DIAGNOSIS — K635 Polyp of colon: Secondary | ICD-10-CM | POA: Diagnosis not present

## 2018-01-06 DIAGNOSIS — Z7984 Long term (current) use of oral hypoglycemic drugs: Secondary | ICD-10-CM | POA: Diagnosis not present

## 2018-01-06 DIAGNOSIS — Z8719 Personal history of other diseases of the digestive system: Secondary | ICD-10-CM | POA: Diagnosis not present

## 2018-01-06 DIAGNOSIS — D3A8 Other benign neuroendocrine tumors: Secondary | ICD-10-CM | POA: Diagnosis present

## 2018-01-06 DIAGNOSIS — D3A012 Benign carcinoid tumor of the ileum: Secondary | ICD-10-CM | POA: Diagnosis present

## 2018-01-06 DIAGNOSIS — E1141 Type 2 diabetes mellitus with diabetic mononeuropathy: Secondary | ICD-10-CM | POA: Diagnosis present

## 2018-01-06 DIAGNOSIS — I25110 Atherosclerotic heart disease of native coronary artery with unstable angina pectoris: Secondary | ICD-10-CM | POA: Diagnosis present

## 2018-01-06 DIAGNOSIS — G588 Other specified mononeuropathies: Secondary | ICD-10-CM | POA: Diagnosis not present

## 2018-01-06 HISTORY — DX: Hyperlipidemia, unspecified: E78.5

## 2018-01-06 HISTORY — DX: Type 2 diabetes mellitus without complications: E11.9

## 2018-01-06 HISTORY — DX: Essential (primary) hypertension: I10

## 2018-01-06 LAB — CBC WITH DIFFERENTIAL/PLATELET
Abs Immature Granulocytes: 0.11 10*3/uL — ABNORMAL HIGH (ref 0.00–0.07)
Basophils Absolute: 0.1 10*3/uL (ref 0.0–0.1)
Basophils Relative: 1 %
Eosinophils Absolute: 0.2 10*3/uL (ref 0.0–0.5)
Eosinophils Relative: 1 %
HCT: 47.6 % (ref 39.0–52.0)
Hemoglobin: 15.1 g/dL (ref 13.0–17.0)
Immature Granulocytes: 1 %
Lymphocytes Relative: 17 %
Lymphs Abs: 2.6 10*3/uL (ref 0.7–4.0)
MCH: 29.3 pg (ref 26.0–34.0)
MCHC: 31.7 g/dL (ref 30.0–36.0)
MCV: 92.4 fL (ref 80.0–100.0)
MONO ABS: 1.1 10*3/uL — AB (ref 0.1–1.0)
Monocytes Relative: 7 %
NEUTROS ABS: 11.1 10*3/uL — AB (ref 1.7–7.7)
Neutrophils Relative %: 73 %
Platelets: 166 10*3/uL (ref 150–400)
RBC: 5.15 MIL/uL (ref 4.22–5.81)
RDW: 12.6 % (ref 11.5–15.5)
WBC: 15.2 10*3/uL — ABNORMAL HIGH (ref 4.0–10.5)
nRBC: 0 % (ref 0.0–0.2)

## 2018-01-06 LAB — COMPREHENSIVE METABOLIC PANEL
ALT: 26 U/L (ref 0–44)
AST: 24 U/L (ref 15–41)
Albumin: 4.3 g/dL (ref 3.5–5.0)
Alkaline Phosphatase: 66 U/L (ref 38–126)
Anion gap: 13 (ref 5–15)
BUN: 24 mg/dL — ABNORMAL HIGH (ref 8–23)
CO2: 22 mmol/L (ref 22–32)
CREATININE: 1.5 mg/dL — AB (ref 0.61–1.24)
Calcium: 9.8 mg/dL (ref 8.9–10.3)
Chloride: 103 mmol/L (ref 98–111)
GFR calc Af Amer: 53 mL/min — ABNORMAL LOW (ref >60)
GFR calc non Af Amer: 46 mL/min — ABNORMAL LOW (ref >60)
Glucose, Bld: 188 mg/dL — ABNORMAL HIGH (ref 70–99)
Potassium: 5.1 mmol/L (ref 3.5–5.1)
Sodium: 138 mmol/L (ref 135–145)
Total Bilirubin: 0.8 mg/dL (ref 0.3–1.2)
Total Protein: 7.2 g/dL (ref 6.5–8.1)

## 2018-01-06 LAB — GLUCOSE, CAPILLARY: Glucose-Capillary: 169 mg/dL — ABNORMAL HIGH (ref 70–99)

## 2018-01-06 LAB — URINALYSIS, ROUTINE W REFLEX MICROSCOPIC
Bilirubin Urine: NEGATIVE
GLUCOSE, UA: NEGATIVE mg/dL
HGB URINE DIPSTICK: NEGATIVE
Ketones, ur: NEGATIVE mg/dL
Leukocytes, UA: NEGATIVE
Nitrite: NEGATIVE
PH: 5 (ref 5.0–8.0)
Protein, ur: NEGATIVE mg/dL

## 2018-01-06 LAB — LIPASE, BLOOD: Lipase: 64 U/L — ABNORMAL HIGH (ref 11–51)

## 2018-01-06 LAB — CBG MONITORING, ED: Glucose-Capillary: 178 mg/dL — ABNORMAL HIGH (ref 70–99)

## 2018-01-06 MED ORDER — HYDROMORPHONE HCL 1 MG/ML IJ SOLN
2.0000 mg | INTRAMUSCULAR | Status: DC | PRN
  Administered 2018-01-06: 2 mg via INTRAVENOUS
  Filled 2018-01-06: qty 2

## 2018-01-06 MED ORDER — SODIUM CHLORIDE 0.9 % IV BOLUS
1000.0000 mL | Freq: Once | INTRAVENOUS | Status: AC
  Administered 2018-01-06: 1000 mL via INTRAVENOUS

## 2018-01-06 MED ORDER — MORPHINE SULFATE (PF) 4 MG/ML IV SOLN
4.0000 mg | Freq: Once | INTRAVENOUS | Status: AC
  Administered 2018-01-06: 4 mg via INTRAVENOUS
  Filled 2018-01-06: qty 1

## 2018-01-06 MED ORDER — ONDANSETRON HCL 4 MG/2ML IJ SOLN
4.0000 mg | Freq: Four times a day (QID) | INTRAMUSCULAR | Status: DC | PRN
  Administered 2018-01-12: 4 mg via INTRAVENOUS
  Filled 2018-01-06: qty 2

## 2018-01-06 MED ORDER — ENOXAPARIN SODIUM 40 MG/0.4ML ~~LOC~~ SOLN: 40.0000 mg | SUBCUTANEOUS | Status: DC

## 2018-01-06 MED ORDER — PANTOPRAZOLE SODIUM 40 MG IV SOLR
40.0000 mg | Freq: Every day | INTRAVENOUS | Status: DC
  Administered 2018-01-07 – 2018-01-18 (×12): 40 mg via INTRAVENOUS
  Filled 2018-01-06 (×12): qty 40

## 2018-01-06 MED ORDER — SODIUM CHLORIDE 0.9 % IV BOLUS: 500.0000 mL | Freq: Once | INTRAVENOUS | Status: DC

## 2018-01-06 MED ORDER — INSULIN ASPART 100 UNIT/ML ~~LOC~~ SOLN
0.0000 [IU] | Freq: Three times a day (TID) | SUBCUTANEOUS | Status: DC
  Administered 2018-01-07 – 2018-01-10 (×4): 1 [IU] via SUBCUTANEOUS
  Administered 2018-01-11 – 2018-01-12 (×3): 2 [IU] via SUBCUTANEOUS
  Administered 2018-01-13: 3 [IU] via SUBCUTANEOUS
  Administered 2018-01-14 – 2018-01-17 (×7): 1 [IU] via SUBCUTANEOUS

## 2018-01-06 MED ORDER — ONDANSETRON HCL 4 MG/2ML IJ SOLN
4.0000 mg | Freq: Once | INTRAMUSCULAR | Status: AC
  Administered 2018-01-06: 4 mg via INTRAVENOUS
  Filled 2018-01-06: qty 2

## 2018-01-06 MED ORDER — ENOXAPARIN SODIUM 40 MG/0.4ML ~~LOC~~ SOLN
40.0000 mg | SUBCUTANEOUS | Status: DC
  Administered 2018-01-06 – 2018-01-11 (×6): 40 mg via SUBCUTANEOUS
  Filled 2018-01-06 (×7): qty 0.4

## 2018-01-06 MED ORDER — DEXTROSE-NACL 5-0.45 % IV SOLN
INTRAVENOUS | Status: DC
  Administered 2018-01-06 – 2018-01-08 (×5): via INTRAVENOUS

## 2018-01-06 MED ORDER — ONDANSETRON HCL 4 MG PO TABS: 4.0000 mg | ORAL_TABLET | Freq: Four times a day (QID) | ORAL | Status: DC | PRN

## 2018-01-06 MED ORDER — IOHEXOL 300 MG/ML  SOLN
100.0000 mL | Freq: Once | INTRAMUSCULAR | Status: AC | PRN
  Administered 2018-01-06: 100 mL via INTRAVENOUS

## 2018-01-06 NOTE — ED Notes (Signed)
Paged surgeon to advise RN's unable to instill NG tube.

## 2018-01-06 NOTE — ED Notes (Signed)
No answer from page to surgeon. Secretary attempting to page so may notify issues w/NG tube.

## 2018-01-06 NOTE — Progress Notes (Signed)
Pt. transported from ER via bed- alert and oriented x4; c/o's abdominal pain; oriented pt. to room and call button.

## 2018-01-06 NOTE — ED Notes (Signed)
Cardiologist in w/pt and family member. Pt denies nausea. Pt states "It hurt when they tried to put the NG tube down".

## 2018-01-06 NOTE — ED Notes (Signed)
Pt aware of hold for NG tube - pt voices appreciation.

## 2018-01-06 NOTE — ED Notes (Signed)
Dr Reece Agar aware pt unable to tolerate attempt w/NG Tube placement and pt denies n/v.

## 2018-01-06 NOTE — ED Provider Notes (Signed)
[facility] EMERGENCY DEPARTMENT Provider Note   CSN: [id number] Arrival date & time: 01/06/18  1157     History   Chief Complaint Chief Complaint  Patient presents with  . Near Syncope  . Abdominal Pain    HPI [name] is a 72 y.o. male with PMHx DM, HTN, s/p partial gastrectomy, presenting to the ED with complaint of acute onset of generalized dull constant abdominal pain that began upon waking up this morning.  Pain does not radiate..  Patient states this morning he was sitting down and went to stand up and he felt immediate lightheadedness and had a syncopal episode, falling back into the chair.  No head trauma.  His family reports he was unconscious for just a few seconds.  When he woke up, he began vomiting.  At that time EMS was present.  They administered 500 cc of normal saline and Zofran.  Patient states his nausea is improved.  Per triage note, EMS recorded initial BP of 90/65, then improved to 107/72 after fluids.  Patient states he still feels somewhat lightheaded.  Per triage note, chest pain was mentioned, however patient states he has chronic right-sided neck pain that radiates towards his chest.  The pain in his chest today is no different than his chronic pain that radiates from his neck.  Patient reports a history of partial stomach removal for stomach ulcers.  States his abdominal pain feels different than pain he had in the past.  No associated fevers, new urinary symptoms, headache, vision changes, or other complaints.  The history is provided by the patient and medical records.    Past Medical History:  Diagnosis Date  . Diabetes mellitus without complication ([place])   . Hyperlipidemia   . Hypertension     Patient Active Problem List   Diagnosis Date Noted  . Small bowel obstruction ([place]) 01/06/2018    Past Surgical History:  Procedure Laterality Date  . ABDOMINAL SURGERY          Home Medications    Prior to Admission  medications   Medication Sig Start Date End Date Taking? Authorizing Provider  amLODipine (NORVASC) 10 MG tablet Take 5 mg by mouth daily.   Yes [provider]  carvedilol (COREG) 6.25 MG tablet Take 3.12 mg by mouth 2 (two) times daily with a meal.   Yes [provider]  clopidogrel (PLAVIX) 75 MG tablet Take 75 mg by mouth daily.   Yes [provider]  Cyanocobalamin (VITAMIN B 12 PO) Take 2 tablets by mouth at bedtime.   Yes [provider]  DULoxetine (CYMBALTA) 30 MG capsule Take 30 mg by mouth daily.   Yes [provider]  lisinopril (PRINIVIL,ZESTRIL) 20 MG tablet Take 20 mg by mouth daily.   Yes [provider]  metFORMIN (GLUCOPHAGE-XR) 500 MG 24 hr tablet Take 500 mg by mouth at bedtime.   Yes [provider]  Multiple Vitamins-Minerals (MULTIVITAMIN PO) Take 1 tablet by mouth 2 (two) times daily.   Yes [provider]  omeprazole (PRILOSEC) 20 MG capsule Take 20 mg by mouth daily.   Yes [provider]  pravastatin (PRAVACHOL) 40 MG tablet Take 40 mg by mouth daily.   Yes [provider]  tamsulosin (FLOMAX) 0.4 MG CAPS capsule Take 0.4 mg by mouth daily.   Yes [provider]    Family History Family History  Problem Relation Age of Onset  . Peripheral Artery Disease Father  Social History Social History   Tobacco Use  . Smoking status: Never Smoker  . Smokeless tobacco: Never Used  Substance Use Topics  . Alcohol use: Not on file  . Drug use: Not on file     Allergies   Latex   Review of Systems Review of Systems  Constitutional: Negative for fever.  Eyes: Negative for visual disturbance.  Gastrointestinal: Positive for abdominal pain, diarrhea (Loose stools, chronic, unchanged), nausea and vomiting.  Neurological: Positive for syncope and light-headedness. Negative for headaches.  All other systems reviewed and are negative.    Physical Exam Updated  Vital Signs BP 120/79 (BP Location: Left Arm)   Pulse 80   Temp 98.3 F (36.8 C) (Oral)   Resp 18   SpO2 93%   Physical Exam  Constitutional: He appears well-developed and well-nourished. He does not appear ill.  HENT:  Head: Normocephalic and atraumatic.  Mouth/Throat: Oropharynx is clear and moist.  Eyes: Conjunctivae are normal.  Cardiovascular: Normal rate, regular rhythm, normal heart sounds and intact distal pulses.  Pulmonary/Chest: Effort normal and breath sounds normal. No respiratory distress.  Abdominal: Soft. Normal appearance and bowel sounds are normal. He exhibits no distension and no mass. There is generalized tenderness. There is no rebound and no guarding.  Neurological: He is alert.  Skin: Skin is warm.  Psychiatric: He has a normal mood and affect. His behavior is normal.  Nursing note and vitals reviewed.    ED Treatments / Results  Labs (all labs ordered are listed, but only abnormal results are displayed) Labs Reviewed  COMPREHENSIVE METABOLIC PANEL - Abnormal; Notable for the following components:      Result Value   Glucose, Bld 188 (*)    BUN 24 (*)    Creatinine, Ser 1.50 (*)    GFR calc non Af Amer 46 (*)    GFR calc Af Amer 53 (*)    All other components within normal limits  LIPASE, BLOOD - Abnormal; Notable for the following components:   Lipase 64 (*)    All other components within normal limits  CBC WITH DIFFERENTIAL/PLATELET - Abnormal; Notable for the following components:   WBC 15.2 (*)    Neutro Abs 11.1 (*)    Monocytes Absolute 1.1 (*)    Abs Immature Granulocytes 0.11 (*)    All other components within normal limits  CBG MONITORING, ED - Abnormal; Notable for the following components:   Glucose-Capillary 178 (*)    All other components within normal limits  URINALYSIS, ROUTINE W REFLEX MICROSCOPIC  COMPREHENSIVE METABOLIC PANEL  CBC  HEMOGLOBIN A1C    EKG EKG Interpretation  Date/Time:  Tuesday January 06 2018 12:11:03  EST Ventricular Rate:  76 PR Interval:    QRS Duration: 82 QT Interval:  358 QTC Calculation: 403 R Axis:   5 Text Interpretation:  Sinus rhythm Confirmed by Lennice Sites [phone]) on 01/06/2018 12:19:34 PM   Radiology Ct Abdomen Pelvis W Contrast  Result Date: 01/06/2018 CLINICAL DATA:  Acute generalized abdominal pain. EXAM: CT ABDOMEN AND PELVIS WITH CONTRAST TECHNIQUE: Multidetector CT imaging of the abdomen and pelvis was performed using the standard protocol following bolus administration of intravenous contrast. CONTRAST:  141mL OMNIPAQUE IOHEXOL 300 MG/ML  SOLN COMPARISON:  CT scan of September 23, 2014. FINDINGS: Lower chest: No acute abnormality. Hepatobiliary: No focal liver abnormality is seen. No gallstones, gallbladder wall thickening, or biliary dilatation. Pancreas: Unremarkable. No pancreatic ductal dilatation or surrounding inflammatory changes. Spleen: Normal in size without  focal abnormality. Adrenals/Urinary Tract: Stable 1.5 cm left adrenal adenoma. Right adrenal gland is unremarkable. Bilateral renal cysts are noted. No hydronephrosis or renal obstruction is noted. Urinary bladder is unremarkable. Bladder is not well visualized due to scatter artifact arising from bilateral hip arthroplasties. Stomach/Bowel: Status post gastric surgery. The appendix appears normal. Diffuse small bowel dilatation is noted which extends to the ileocecal valve. Definite transition zone is not identified. No colonic dilatation is noted. Vascular/Lymphatic: No significant vascular findings are present. No enlarged abdominal or pelvic lymph nodes. Reproductive: Prostate not well-visualized due to previously described scatter artifact from bilateral hip arthroplasties. Other: No abdominal wall hernia or abnormality. No abdominopelvic ascites. Musculoskeletal: Bilateral hip arthroplasties as previously mentioned. No acute osseous abnormality is noted. Multilevel degenerative disc disease is noted in lower  lumbar spine. IMPRESSION: Diffuse small bowel dilatation is noted without definite transition zone seen. This is concerning for distal small bowel obstruction although ileus cannot be excluded. Follow-up radiographs are recommended. No colonic dilatation is noted. Stable left adrenal adenoma. Electronically Signed   By: Marijo Conception, M.D.   On: 01/06/2018 15:32    Procedures Procedures (including critical care time)  Medications Ordered in ED Medications  ondansetron (ZOFRAN) tablet 4 mg (has no administration in time range)    Or  ondansetron (ZOFRAN) injection 4 mg (has no administration in time range)  pantoprazole (PROTONIX) injection 40 mg (has no administration in time range)  HYDROmorphone (DILAUDID) injection 2 mg (has no administration in time range)  dextrose 5 %-0.45 % sodium chloride infusion ( Intravenous New Bag/Given 01/06/18 1846)  insulin aspart (novoLOG) injection 0-9 Units (has no administration in time range)  enoxaparin (LOVENOX) injection 40 mg (has no administration in time range)  sodium chloride 0.9 % bolus 1,000 mL (0 mLs Intravenous Stopped 01/06/18 1533)  iohexol (OMNIPAQUE) 300 MG/ML solution 100 mL (100 mLs Intravenous Contrast Given 01/06/18 1509)  morphine 4 MG/ML injection 4 mg (4 mg Intravenous Given 01/06/18 1534)  ondansetron (ZOFRAN) injection 4 mg (4 mg Intravenous Given 01/06/18 1551)     Initial Impression / Assessment and Plan / ED Course  I have reviewed the triage vital signs and the nursing notes.  Pertinent labs & imaging results that were available during my care of the patient were reviewed by me and considered in my medical decision making (see chart for details).  Clinical Course as of Jan 06 1905  Tue Jan 06, 2018  1608 Pt discussed with Jerene Pitch with surgery. She will review his CT and call back with recommendations.   [JR]  1633 Surgery recommending medical admission given recent heart stent anticoagulation.  She will order NG tube.    [JR]  [id number] Internal medicine teaching service accepting admission.   [JR]    Clinical Course User Index [JR] Robinson, [place] N, PA-C    Patient presenting with abdominal pain, nausea, vomiting that began this morning.  He also had a syncopal episode upon standing up, that head trauma, patient was hypotensive on EMS arrival, however improved with fluids.  This sounds to be orthostatic in nature likely secondary to dehydration, no longer lightheaded in the ED.  Abdomen is diffusely tender without peritoneal signs.  Labs with leukocytosis 15.2.  Mild AKI of creatinine 1.5, BUN 24.  Remainder of electrolytes are reassuring.  Lipase is mildly elevated 64.  CT scan showing diffuse small bowel dilation, concerning for obstruction versus ileus.  Surgery was consulted who reviewed imaging, recommends medical admission with NG tube.  Patient is agreeable to this plan.  Internal medicine teaching service accepting admission.   Discussed results, findings, treatment and follow up. Patient advised of return precautions. Patient verbalized understanding and agreed with plan.  Final Clinical Impressions(s) / ED Diagnoses   Final diagnoses:  Small bowel obstruction ([place])  Dehydration  AKI (acute kidney injury) [facility])    ED Discharge Orders    None       Robinson, [place] N, PA-C 01/06/18 1906    Lennice Sites, DO 01/06/18 1947

## 2018-01-06 NOTE — ED Triage Notes (Signed)
Pt arrives via [facility], pt was having severe abdominal pain this morning with vomiting and some chest pain. On EMS arrival pt was put on a chair to rest and when he stood he has a syncopal episode. Pt denies hx of, but reports he's been getting dizzy for a couple months. Per EMS, pt was given 4 of Zofran and 500 of NS. Initial BP of 90/65 and last one was 107/72. Pt denies current CP, but reports abdominal pain is 4/10.

## 2018-01-06 NOTE — Consult Note (Signed)
Cardiology Consultation:   Patient ID: COAL NEARHOOD MRN: [id number]; DOB: 04/12/1945  Admit date: 01/06/2018 Date of Consult: 01/06/2018  Primary Care Provider: Jefm Petty, MD Primary Cardiologist: No primary care provider on file. Lance ([facility]) Primary Electrophysiologist:  None    Patient Profile:   [name] is a 72 y.o. male with a history of DM, HTN, hyperlipidemia and CAD who is being seen today for discussion regarding his Plavix therapy at the request of Dr. Ninfa Linden.   History of Present Illness:   [name] is a 72 yo male with history of DM, HTN and CAD with coronary stent placement in June 2019 at [facility] who is now admitted with c/o abdominal pain. He is found to have a small bowel obstruction. Cardiology is asked to see him to discuss his Plavix therapy. He had a Synergy drug eluting stent placed in the RCA at [facility] on 07/03/17. He has done well since then. He has had no chest pain, dyspnea, dizziness, near syncope or syncope. He has been feeling well except for the recent abdominal pain, nausea and vomiting.   Past Medical History:  Diagnosis Date  . Diabetes mellitus without complication ([place])   . Hyperlipidemia   . Hypertension     Past Surgical History:  Procedure Laterality Date  . ABDOMINAL SURGERY       Home Medications:  Prior to Admission medications   Medication Sig Start Date End Date Taking? Authorizing Provider  amLODipine (NORVASC) 10 MG tablet Take 5 mg by mouth daily.   Yes [provider]  carvedilol (COREG) 6.25 MG tablet Take 3.12 mg by mouth 2 (two) times daily with a meal.   Yes [provider]  clopidogrel (PLAVIX) 75 MG tablet Take 75 mg by mouth daily.   Yes [provider]  Cyanocobalamin (VITAMIN B 12 PO) Take 2 tablets by mouth at bedtime.   Yes [provider]  DULoxetine (CYMBALTA) 30 MG capsule Take 30 mg by mouth daily.   Yes [provider]  lisinopril (PRINIVIL,ZESTRIL) 20 MG tablet Take 20 mg by mouth daily.   Yes [provider]  metFORMIN (GLUCOPHAGE-XR) 500 MG 24 hr tablet Take 500 mg by mouth at bedtime.   Yes [provider]  Multiple Vitamins-Minerals (MULTIVITAMIN PO) Take 1 tablet by mouth 2 (two) times daily.   Yes [provider]  omeprazole (PRILOSEC) 20 MG capsule Take 20 mg by mouth daily.   Yes [provider]  pravastatin (PRAVACHOL) 40 MG tablet Take 40 mg by mouth daily.   Yes [provider]  tamsulosin (FLOMAX) 0.4 MG CAPS capsule Take 0.4 mg by mouth daily.   Yes [provider]    Inpatient Medications: Scheduled Meds: . enoxaparin (LOVENOX) injection  40 mg Subcutaneous Q24H  . [START ON 01/07/2018] insulin aspart  0-9 Units Subcutaneous TID WC  . [START ON 01/07/2018] pantoprazole (PROTONIX) IV  40 mg Intravenous Q0600   Continuous Infusions: . dextrose 5 % and 0.45% NaCl 100 mL/hr at 01/06/18 1846   PRN Meds: HYDROmorphone (DILAUDID) injection, ondansetron **OR** ondansetron (ZOFRAN) IV  Allergies:    Allergies  Allergen Reactions  . Latex Itching    Social History:   Social History   Socioeconomic History  . Marital status: Married    Spouse name: Not on file  . Number of children: Not on file  . Years of education: Not on file  . Highest education level: Not on  file  Occupational History  . Not on file  Social Needs  . Financial resource strain: Not on file  . Food insecurity:    Worry: Not on file    Inability: Not on file  . Transportation needs:    Medical: Not on file    Non-medical: Not on file  Tobacco Use  . Smoking status: Never Smoker  . Smokeless tobacco: Never Used  Substance and Sexual Activity  . Alcohol use: Not on file  . Drug use: Not on file  . Sexual activity: Not on file  Lifestyle  . Physical activity:    Days per week: Not on file    Minutes per session: Not on file  . Stress: Not  on file  Relationships  . Social connections:    Talks on phone: Not on file    Gets together: Not on file    Attends religious service: Not on file    Active member of club or organization: Not on file    Attends meetings of clubs or organizations: Not on file    Relationship status: Not on file  . Intimate partner violence:    Fear of current or ex partner: Not on file    Emotionally abused: Not on file    Physically abused: Not on file    Forced sexual activity: Not on file  Other Topics Concern  . Not on file  Social History Narrative  . Not on file    Family History:   Family History  Problem Relation Age of Onset  . Peripheral Artery Disease Father      ROS:  Please see the history of present illness.  All other ROS reviewed and negative.     Physical Exam/Data:   Vitals:   01/06/18 1430 01/06/18 1445 01/06/18 1534 01/06/18 1838  BP: 125/87 118/77 134/84 120/79  Pulse: 82 81 81 80  Resp: 14 20 16 18   Temp:    98.3 F (36.8 C)  TempSrc:    Oral  SpO2: 100% 100% 100% 93%    Intake/Output Summary (Last 24 hours) at 01/06/2018 1849 Last data filed at 01/06/2018 1533 Gross per 24 hour  Intake 1000 ml  Output -  Net 1000 ml   There were no vitals filed for this visit. There is no height or weight on file to calculate BMI.  General:  Well nourished, well developed, in no acute distress HEENT: normal Lymph: no adenopathy Neck: no JVD Endocrine:  No thryomegaly Vascular: No carotid bruits; FA pulses 2+ bilaterally without bruits  Cardiac:  normal S1, S2; RRR; no murmur  Lungs:  clear to auscultation bilaterally, no wheezing, rhonchi or rales  Abd: soft, tender to palpation.  Ext: no edema Musculoskeletal:  No deformities, BUE and BLE strength normal and equal Skin: warm and dry  Neuro:  CNs 2-12 intact, no focal abnormalities noted Psych:  Normal affect   EKG:  The EKG was personally reviewed and demonstrates:  Sinus, no ischemic changes Telemetry:   Telemetry was personally reviewed and demonstrates:  sinus  Relevant CV Studies:   Laboratory Data:  Chemistry Recent Labs  Lab 01/06/18 1245  NA 138  K 5.1  CL 103  CO2 22  GLUCOSE 188*  BUN 24*  CREATININE 1.50*  CALCIUM 9.8  GFRNONAA 46*  GFRAA 53*  ANIONGAP 13    Recent Labs  Lab 01/06/18 1245  PROT 7.2  ALBUMIN 4.3  AST 24  ALT 26  ALKPHOS 66  BILITOT  0.8   Hematology Recent Labs  Lab 01/06/18 1245  WBC 15.2*  RBC 5.15  HGB 15.1  HCT 47.6  MCV 92.4  MCH 29.3  MCHC 31.7  RDW 12.6  PLT 166   Cardiac EnzymesNo results for input(s): TROPONINI in the last 168 hours. No results for input(s): TROPIPOC in the last 168 hours.  BNPNo results for input(s): BNP, PROBNP in the last 168 hours.  DDimer No results for input(s): DDIMER in the last 168 hours.  Radiology/Studies:  Ct Abdomen Pelvis W Contrast  Result Date: 01/06/2018 CLINICAL DATA:  Acute generalized abdominal pain. EXAM: CT ABDOMEN AND PELVIS WITH CONTRAST TECHNIQUE: Multidetector CT imaging of the abdomen and pelvis was performed using the standard protocol following bolus administration of intravenous contrast. CONTRAST:  181mL OMNIPAQUE IOHEXOL 300 MG/ML  SOLN COMPARISON:  CT scan of September 23, 2014. FINDINGS: Lower chest: No acute abnormality. Hepatobiliary: No focal liver abnormality is seen. No gallstones, gallbladder wall thickening, or biliary dilatation. Pancreas: Unremarkable. No pancreatic ductal dilatation or surrounding inflammatory changes. Spleen: Normal in size without focal abnormality. Adrenals/Urinary Tract: Stable 1.5 cm left adrenal adenoma. Right adrenal gland is unremarkable. Bilateral renal cysts are noted. No hydronephrosis or renal obstruction is noted. Urinary bladder is unremarkable. Bladder is not well visualized due to scatter artifact arising from bilateral hip arthroplasties. Stomach/Bowel: Status post gastric surgery. The appendix appears normal. Diffuse small bowel  dilatation is noted which extends to the ileocecal valve. Definite transition zone is not identified. No colonic dilatation is noted. Vascular/Lymphatic: No significant vascular findings are present. No enlarged abdominal or pelvic lymph nodes. Reproductive: Prostate not well-visualized due to previously described scatter artifact from bilateral hip arthroplasties. Other: No abdominal wall hernia or abnormality. No abdominopelvic ascites. Musculoskeletal: Bilateral hip arthroplasties as previously mentioned. No acute osseous abnormality is noted. 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 IMPRESSION: Diffuse small bowel dilatation is noted without definite transition zone seen. This is concerning for distal small bowel obstruction although ileus cannot be excluded. Follow-up radiographs are recommended. No colonic dilatation is noted. Stable left adrenal adenoma. Electronically Signed   By: Marijo Conception, M.D.   On: 01/06/2018 15:32    Assessment and Plan:   1. CAD without angina: He is on ASA and Plavix. His coronary stent was placed in June 2019. He may need surgery for his bowel obstruction. Our most recent studies would suggest that 6 months of dual anti-platelet therapy should be adequate with the newest generation drug eluting stents. His stent was placed in the setting of unstable angina but not in the setting of an acute MI. If surgery is necessary, his Plavix can be held. I would recommend holding Plavix 5 days before the planned surgical procedure to allow washout. He can proceed with the surgery from a cardiac standpoint.   For questions or updates, please contact [place] Please consult [email or web address] for contact info under   Signed, Lauree Chandler, MD  01/06/2018 6:49 PM

## 2018-01-06 NOTE — ED Provider Notes (Signed)
Medical screening examination/treatment/procedure(s) were conducted as a shared visit with non-physician practitioner(s) and myself.  I personally evaluated the patient during the encounter. Briefly, the patient is a 72 y.o. male with history of diabetes, hypertension, prior gastrectomy who presents to the ED with abdominal pain, near syncope.  Patient with overall normal vitals.  No fever.  Patient with diffuse abdominal pain, generalized weakness.  She states that he feels nauseous and has vomited.  Patient with no recent bowel movement and is not passing gas.  Denies any chest pain.  EKG shows sinus rhythm.  No signs of ischemic changes.  No concern for cardiac process.  Lab work shows mild leukocytosis.  Otherwise no significant anemia, electrolyte abnormality, kidney injury.  Patient with diffuse tenderness on abdominal exam. Appears mildly distended. Overall appears uncomfortable on exam.  CT scan was ordered that showed small bowel obstruction versus ileus.  However given history and physical likely patient has small bowel obstruction.  General surgery consulted and patient to be admitted to hospitalist service for further hydration and care.  Hemodynamically stable throughout my care.  This chart was dictated using voice recognition software.  Despite best efforts to proofread,  errors can occur which can change the documentation meaning.    EKG Interpretation  Date/Time:  Tuesday January 06 2018 12:11:03 EST Ventricular Rate:  76 PR Interval:    QRS Duration: 82 QT Interval:  358 QTC Calculation: 403 R Axis:   5 Text Interpretation:  Sinus rhythm Confirmed by Lennice Sites [phone]) on 01/06/2018 12:19:34 PM          Lennice Sites, DO 01/06/18 1636

## 2018-01-06 NOTE — Consult Note (Addendum)
[facility] Consult Note  [name] [birth year]  [id number].    Requesting MD: Lennice Sites Chief Complaint/Reason for Consult: SBO  HPI:  STEFEN JUBA is a 72yo male PMH DM, HTN, CAD s/p cardiac stent placement 06/2017 on plavix (last dose this morning) who presented to [facility] earlier today with acute onset diffuse abdominal pain. States that he woke up this morning with pain. It has since gotten worse and is associated with multiple episodes of nausea and vomiting. States that he has had chronic diarrhea ever since his partial gastrectomy for ulcer disease in 1993, and his last BM was last night. No flatus today. Denies fever, chills, chest pain, shortness of breath. States that he has never had pain like this before.  Denies any recent sick contacts. Of note, patient was seen by Dr. Dema Severin in 06/2017 for evaluation of an appendiceal orifice polyp. He underwent a diagnostic colonoscopy for diarrhea 04/30/17 at the [facility] and was found to have diverticulosis in the sigmoid and left-sided colon, appendiceal orifice villous adenoma polyp that was biopsied but not removed, normal-appearing terminal ileum, 2 small ascending polyps removed, random colon biopsies, and hemorrhoids on retroflexion. Patient was planning for surgery in March 2020, at which point his cardiologist would clear him for elective surgery.  In the ED patient had a CT scan concerning for SBO. After further review with the radiologist there is concern that the polyp near his appendiceal orifice may be causing his obstruction. WBC 15.2, Cr 1.50.  Nonsmoker Retired from [employer]  ROS: Review of Systems  Constitutional: Negative.   HENT: Negative.   Eyes: Negative.   Respiratory: Negative.   Cardiovascular: Negative.   Gastrointestinal: Positive for abdominal pain, diarrhea, nausea and vomiting.  Genitourinary: Negative.   Musculoskeletal: Negative.   Skin: Negative.   Neurological: Negative.    All  systems reviewed and otherwise negative except for as above  No family history on file.  Past Medical History:  Diagnosis Date  . Diabetes mellitus without complication ([place])   . Hypertension     Past Surgical History:  Procedure Laterality Date  . ABDOMINAL SURGERY      Social History:  reports that he has never smoked. He has never used smokeless tobacco. His alcohol and drug histories are not on file.  Allergies:  Allergies  Allergen Reactions  . Latex Itching     (Not in a hospital admission)  Prior to Admission medications   Not on File    Blood pressure 118/77, pulse 81, temperature 97.7 F (36.5 C), temperature source Oral, resp. rate 20, SpO2 100 %. Physical Exam: General: pleasant, WD/WN white male who is laying in bed in NAD HEENT: head is normocephalic, atraumatic.  Sclera are noninjected.  Pupils equal and round.  Ears and nose without any masses or lesions.  Mouth is pink and moist. Dentition fair Heart: regular, rate, and rhythm.  No obvious murmurs, gallops, or rubs noted.  Palpable pedal pulses bilaterally Lungs: CTAB, no wheezes, rhonchi, or rales noted.  Respiratory effort nonlabored Abd: well healed midline incision, soft, distended, hypoactive BS, no masses, hernias, or organomegaly. Mild diffuse TTP without rebound or guarding, no peritonitis MS: all 4 extremities are symmetrical with no cyanosis, clubbing, or edema. Skin: warm and dry with no masses, lesions, or rashes Psych: A&Ox3 with an appropriate affect. Neuro: cranial nerves grossly intact, extremity CSM intact bilaterally, normal speech  Results for orders placed or performed during the hospital encounter of 01/06/18 (from the  past 48 hour(s))  CBG monitoring, ED     Status: Abnormal   Collection Time: 01/06/18 12:07 PM  Result Value Ref Range   Glucose-Capillary 178 (H) 70 - 99 mg/dL  Comprehensive metabolic panel     Status: Abnormal   Collection Time: 01/06/18 12:45 PM  Result Value  Ref Range   Sodium 138 135 - 145 mmol/L   Potassium 5.1 3.5 - 5.1 mmol/L   Chloride 103 98 - 111 mmol/L   CO2 22 22 - 32 mmol/L   Glucose, Bld 188 (H) 70 - 99 mg/dL   BUN 24 (H) 8 - 23 mg/dL   Creatinine, Ser 1.50 (H) 0.61 - 1.24 mg/dL   Calcium 9.8 8.9 - 10.3 mg/dL   Total Protein 7.2 6.5 - 8.1 g/dL   Albumin 4.3 3.5 - 5.0 g/dL   AST 24 15 - 41 U/L   ALT 26 0 - 44 U/L   Alkaline Phosphatase 66 38 - 126 U/L   Total Bilirubin 0.8 0.3 - 1.2 mg/dL   GFR calc non Af Amer 46 (L) >60 mL/min   GFR calc Af Amer 53 (L) >60 mL/min   Anion gap 13 5 - 15    Comment: Performed at [facility], [address], [place], [place] [address]  Lipase, blood     Status: Abnormal   Collection Time: 01/06/18 12:45 PM  Result Value Ref Range   Lipase 64 (H) 11 - 51 U/L    Comment: Performed at [facility] [address] [place], [place] [address]  CBC with Differential     Status: Abnormal   Collection Time: 01/06/18 12:45 PM  Result Value Ref Range   WBC 15.2 (H) 4.0 - 10.5 K/uL   RBC 5.15 4.22 - 5.81 MIL/uL   Hemoglobin 15.1 13.0 - 17.0 g/dL   HCT 47.6 39.0 - 52.0 %   MCV 92.4 80.0 - 100.0 fL   MCH 29.3 26.0 - 34.0 pg   MCHC 31.7 30.0 - 36.0 g/dL   RDW 12.6 11.5 - 15.5 %   Platelets 166 150 - 400 K/uL   nRBC 0.0 0.0 - 0.2 %   Neutrophils Relative % 73 %   Neutro Abs 11.1 (H) 1.7 - 7.7 K/uL   Lymphocytes Relative 17 %   Lymphs Abs 2.6 0.7 - 4.0 K/uL   Monocytes Relative 7 %   Monocytes Absolute 1.1 (H) 0.1 - 1.0 K/uL   Eosinophils Relative 1 %   Eosinophils Absolute 0.2 0.0 - 0.5 K/uL   Basophils Relative 1 %   Basophils Absolute 0.1 0.0 - 0.1 K/uL   Immature Granulocytes 1 %   Abs Immature Granulocytes 0.11 (H) 0.00 - 0.07 K/uL    Comment: Performed at [facility] [address], [place], [place] [address]   Ct Abdomen Pelvis W Contrast  Result Date: 01/06/2018 CLINICAL DATA:  Acute generalized abdominal pain. EXAM: CT ABDOMEN AND PELVIS WITH CONTRAST TECHNIQUE:  Multidetector CT imaging of the abdomen and pelvis was performed using the standard protocol following bolus administration of intravenous contrast. CONTRAST:  175mL OMNIPAQUE IOHEXOL 300 MG/ML  SOLN COMPARISON:  CT scan of September 23, 2014. FINDINGS: Lower chest: No acute abnormality. Hepatobiliary: No focal liver abnormality is seen. No gallstones, gallbladder wall thickening, or biliary dilatation. Pancreas: Unremarkable. No pancreatic ductal dilatation or surrounding inflammatory changes. Spleen: Normal in size without focal abnormality. Adrenals/Urinary Tract: Stable 1.5 cm left adrenal adenoma. Right adrenal gland is unremarkable.  Bilateral renal cysts are noted. No hydronephrosis or renal obstruction is noted. Urinary bladder is unremarkable. Bladder is not well visualized due to scatter artifact arising from bilateral hip arthroplasties. Stomach/Bowel: Status post gastric surgery. The appendix appears normal. Diffuse small bowel dilatation is noted which extends to the ileocecal valve. Definite transition zone is not identified. No colonic dilatation is noted. Vascular/Lymphatic: No significant vascular findings are present. No enlarged abdominal or pelvic lymph nodes. Reproductive: Prostate not well-visualized due to previously described scatter artifact from bilateral hip arthroplasties. Other: No abdominal wall hernia or abnormality. No abdominopelvic ascites. Musculoskeletal: Bilateral hip arthroplasties as previously mentioned. No acute osseous abnormality is noted. 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 IMPRESSION: Diffuse small bowel dilatation is noted without definite transition zone seen. This is concerning for distal small bowel obstruction although ileus cannot be excluded. Follow-up radiographs are recommended. No colonic dilatation is noted. Stable left adrenal adenoma. Electronically Signed   By: Marijo Conception, M.D.   On: 01/06/2018 15:32       Assessment/Plan DM HTN CAD s/p cardiac stent placement 06/2017 on plavix (last dose this morning) - cardiologist is Dr. Gerarda Gunther at [facility] in [place] H/o partial gastrectomy for ulcer disease in 1993 Polyp at/near appendiceal orifice polyp  SBO - Patient with SBO, possibly secondary to known polyp at/near appendiceal orifice. Recommend admit to medicine with his health issues listed above. I have called cardiology for cardiac clearance for possible surgery. NG tube for decompression and symptom relief. Check abdominal xray in the AM. Please hold plavix.   Wellington Hampshire, [facility] 01/06/2018, 4:36 PM Pager: [phone] Mon 7:00 am -11:30 AM Tues-Fri 7:00 am-4:30 pm Sat-Sun 7:00 am-11:30 am

## 2018-01-06 NOTE — ED Notes (Signed)
Lying on bed - states resting comfortably. Spouse leaving at this time.

## 2018-01-06 NOTE — ED Notes (Addendum)
This RN with [facility], RN attempted NG tube, unsuccessful. Shan Levans, MD messaged.

## 2018-01-06 NOTE — ED Notes (Signed)
Admitting team at bedside.

## 2018-01-06 NOTE — H&P (Addendum)
Date: 01/06/2018               Patient Name:  [name] MRN: [id number]  DOB: Jan 25, 1946 Age / Sex: 72 y.o., male   PCP: Jefm Petty, MD         Medical Service: Internal Medicine Teaching Service         Attending Physician: Dr. Evette Doffing, Mallie Mussel, *    First Contact: Dr. Nita Sickle MD Pager: [phone]  Second Contact: Dr. Vickki Muff MD Pager: [phone]       After Hours (After 5p/  First Contact Pager: [phone]  weekends / holidays): Second Contact Pager: [phone]   Chief Complaint: Abdominal pain  History of Present Illness: [name] is a 72 year old male with diabetes, hypertension, CAD s/p cardiac stent placement 06/2017 on Plavix who presented with abdominal pain.   He states that he was in his usual state of health this morning when he was visiting the [employer] store.  He went to the bathroom because he felt the need to have a bowel movement.  He he states that even after several minutes of trying he could not go to the bathroom and subsequently stood up. This is when he began to feel very lightheaded and sat back down on the toilet.  Shortly after this he began having several episodes of nonbilious nonbloody vomiting.  EMS was subsequently called and he was given normal saline and Zofran.  He currently has nausea and feels somewhat lightheaded.  He normally has 4-5 bowel movements per day and his last bowel movement was last night.  He denies syncope, chest pain, and palpitations.  He denies headaches, vision changes, fevers, new urinary symptoms.  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  He reports a partial gastrectomy for stomach ulcers in 1993.  Additionally he was referred to general surgery in June 2019 for evaluation of an appendiceal orifice polyp.  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  Meds:  Current Meds  Medication Sig  . amLODipine (NORVASC) 10 MG tablet Take 5 mg by mouth daily.  . carvedilol (COREG) 6.25 MG tablet Take 3.12 mg by mouth 2 (two) times daily with a meal.  . clopidogrel (PLAVIX) 75 MG tablet Take 75 mg by mouth daily.  . Cyanocobalamin (VITAMIN B 12 PO) Take 2 tablets by mouth at bedtime.  . DULoxetine (CYMBALTA) 30 MG capsule Take 30 mg by mouth daily.  Marland Kitchen lisinopril (PRINIVIL,ZESTRIL) 20 MG tablet Take 20 mg by mouth daily.  . metFORMIN (GLUCOPHAGE-XR) 500 MG 24 hr tablet Take 500 mg by mouth at bedtime.  . Multiple Vitamins-Minerals (MULTIVITAMIN PO) Take 1 tablet by mouth 2 (two) times daily.  Marland Kitchen omeprazole (PRILOSEC) 20 MG capsule Take 20 mg by mouth daily.  . pravastatin (PRAVACHOL) 40 MG tablet Take 40 mg by mouth daily.  . tamsulosin (FLOMAX) 0.4 MG CAPS capsule Take 0.4 mg by mouth daily.     Allergies: Allergies as of 01/06/2018 - Review Complete 01/06/2018  Allergen Reaction Noted  . Latex Itching 01/06/2018   Past Medical History:  Diagnosis Date  . Diabetes mellitus without complication ([place])   . Hypertension     Family History: Father had locked carotid arteries.  Social History:  He currently lives with his wife.  He is a retired Adult nurse."  He quit smoking tobacco at the age of 26.  He states that he drinks very rarely--once every 6 months when he goes fishing.  He denies any illicit drug use.  Review of Systems: A complete ROS was negative except as per HPI.   Physical Exam: Blood pressure 118/77, pulse 81, temperature 97.7 F (36.5 C), temperature source Oral, resp. rate 20, SpO2 100 %. Physical Exam  Constitutional: He appears well-developed and well-nourished.  HENT:  Head: Normocephalic and atraumatic.  Eyes: EOM are normal.  Cardiovascular: Normal rate, regular rhythm, normal heart sounds and intact distal pulses.    Pulmonary/Chest: Effort normal and breath sounds normal. No respiratory distress.  Abdominal: Soft. Normal appearance.  Decreased bowel sounds.  No tenderness to palpation of the abdomen.  Neurological: He is alert.  Skin: Skin is warm and dry.  Psychiatric: He has a normal mood and affect. His behavior is normal.  Nursing note and vitals reviewed.  Labs: CMP: BUN- 24, Creatinine-1.50 Lipase 64 CBC: Leukocytosis 15.2 with neutrophil predominance  EKG: personally reviewed my interpretation is normal sinus rhythm.  CT Abd/Pelvis:  IMPRESSION: Diffuse small bowel dilatation is noted without definite transition zone seen. This is concerning for distal small bowel obstruction although ileus cannot be excluded. Follow-up radiographs are recommended. No colonic dilatation is noted. Stable left adrenal adenoma.  Assessment & Plan by Problem: Active Problems:   * No active hospital problems. *  [name] is a 72 year old male with CAD s/p cardiac stent placement 06/2017 on Plavix and partial gastrectomy for ulcer disease in 1993 who presents with emesis who was found to have a small bowel obstruction likely caused by the known polyp at appendiceal orifice.  General surgery reviewed CT scan with radiology and there does appear to be an enhancing lesion around the area of ilio cecal valve which may represent the polyp seen on previous colonoscopy.  The plan is for nasogastric tube for decompression of the small bowel with likely laparoscopic assisted ileocolectomy to excise the affected area.  Small Bowel obstruction: - Place NG tube - D5 1/2 NS at 100 mL/hr - Keep n.p.o. - Dilaudid 2 mg every 4 hours as needed pain - Zofran 4 mg every 6 hours PRN nausea - Pantoprazole 40 mg daily  CAD s/p cardiac stent placement 06/2017 on Plavix:  - Hold Plavix for now - Follow-up cardiology recommendations for continued Plavix use.  Hypertension: Home medications include amlodipine 10 mg daily and  carvedilol 6.25 mg twice daily.  Blood pressures have been within normal limits this morning. - We will hold blood pressure medications given that he is n.p.o.  Will reconsider IV antihypertensives if blood pressure increases.  Acute kidney injury: Creatinine 1.50.  Baseline approximately 1.2.  Likely prerenal in nature given emesis and decreased p.o. intake. - IV fluids per above  Dispo: Admit patient to Inpatient with expected length of stay greater than 2 midnights.  Signed: Carroll Sage, MD 01/06/2018, 4:42 PM  Pager: [phone]

## 2018-01-06 NOTE — ED Notes (Signed)
Patient transported to CT 

## 2018-01-07 ENCOUNTER — Inpatient Hospital Stay (HOSPITAL_COMMUNITY): Payer: Medicare HMO

## 2018-01-07 DIAGNOSIS — N179 Acute kidney failure, unspecified: Secondary | ICD-10-CM

## 2018-01-07 DIAGNOSIS — I25810 Atherosclerosis of coronary artery bypass graft(s) without angina pectoris: Secondary | ICD-10-CM | POA: Diagnosis present

## 2018-01-07 DIAGNOSIS — D121 Benign neoplasm of appendix: Secondary | ICD-10-CM

## 2018-01-07 LAB — COMPREHENSIVE METABOLIC PANEL
ALK PHOS: 45 U/L (ref 38–126)
ALT: 21 U/L (ref 0–44)
AST: 20 U/L (ref 15–41)
Albumin: 3.3 g/dL — ABNORMAL LOW (ref 3.5–5.0)
Anion gap: 8 (ref 5–15)
BILIRUBIN TOTAL: 0.7 mg/dL (ref 0.3–1.2)
BUN: 21 mg/dL (ref 8–23)
CO2: 26 mmol/L (ref 22–32)
Calcium: 8.2 mg/dL — ABNORMAL LOW (ref 8.9–10.3)
Chloride: 106 mmol/L (ref 98–111)
Creatinine, Ser: 1.19 mg/dL (ref 0.61–1.24)
GFR calc Af Amer: >60 mL/min (ref >60)
GFR calc non Af Amer: >60 mL/min (ref >60)
Glucose, Bld: 124 mg/dL — ABNORMAL HIGH (ref 70–99)
Potassium: 3.9 mmol/L (ref 3.5–5.1)
SODIUM: 140 mmol/L (ref 135–145)
TOTAL PROTEIN: 5.5 g/dL — AB (ref 6.5–8.1)

## 2018-01-07 LAB — CBC
HCT: 39.4 % (ref 39.0–52.0)
Hemoglobin: 12.6 g/dL — ABNORMAL LOW (ref 13.0–17.0)
MCH: 29.4 pg (ref 26.0–34.0)
MCHC: 32 g/dL (ref 30.0–36.0)
MCV: 91.8 fL (ref 80.0–100.0)
Platelets: 120 10*3/uL — ABNORMAL LOW (ref 150–400)
RBC: 4.29 MIL/uL (ref 4.22–5.81)
RDW: 12.8 % (ref 11.5–15.5)
WBC: 8.4 10*3/uL (ref 4.0–10.5)
nRBC: 0 % (ref 0.0–0.2)

## 2018-01-07 LAB — GLUCOSE, CAPILLARY
Glucose-Capillary: 123 mg/dL — ABNORMAL HIGH (ref 70–99)
Glucose-Capillary: 109 mg/dL — ABNORMAL HIGH (ref 70–99)
Glucose-Capillary: 97 mg/dL (ref 70–99)

## 2018-01-07 LAB — HEMOGLOBIN A1C
Hgb A1c MFr Bld: 6.6 % — ABNORMAL HIGH (ref 4.8–5.6)
Mean Plasma Glucose: 142.72 mg/dL

## 2018-01-07 MED ORDER — INFLUENZA VAC SPLIT HIGH-DOSE 0.5 ML IM SUSY
0.5000 mL | PREFILLED_SYRINGE | INTRAMUSCULAR | Status: AC
  Administered 2018-01-08: 0.5 mL via INTRAMUSCULAR
  Filled 2018-01-07: qty 0.5

## 2018-01-07 NOTE — Progress Notes (Signed)
   Subjective: [name] reports that his abdominal pain and nausea has significantly improved since yesterday.  He denies any recent episodes of emesis--last one being yesterday morning.  He is passing flatus.  Objective:  Vital signs in last 24 hours: Vitals:   01/06/18 1838 01/06/18 2042 01/06/18 2042 01/07/18 0548  BP: 120/79 138/75 138/75 127/77  Pulse: 80 76 76 66  Resp: 18 18 18 16   Temp: 98.3 F (36.8 C) (!) 97.4 F (36.3 C) (!) 97.4 F (36.3 C) 97.9 F (36.6 C)  TempSrc: Oral Oral Oral Oral  SpO2: 93% 96% 96% 94%   Physical Exam  Abdominal:  Soft, nontender to palpation, nondistended abdomen.  Neurological: He is alert.  Psychiatric: He has a normal mood and affect. His behavior is normal.   Assessment/Plan:  Active Problems:   Small bowel obstruction [facility])  [name] is is admitted for small bowel obstruction likely caused by a known polyp at the appendiceal orifice.  Nasogastric tube placement was unsuccessful.  He denies nausea and vomiting this morning.  Abdominal x-ray this morning shows mildly prominent right abdominal small bowel loop with overall improvement in small bowel gas pattern.  Plan is for continued medical management with possible ileocolectomy if he does not continue to improve.  I am reassured that he is doing well given that abdominal pain and nausea have significantly improved.  Will continue holding Plavix per cardiology in anticipation of a possible abdominal surgery.  Small bowel obstruction: - D5 half-normal saline at 100 mils per hour - Keep n.p.o. except for ice chips. - Dilaudid 2 mg every 4 hours as needed for pain - Zofran 4 mg every 6 hours PRN nausea - Pantoprazole 40 mg daily  CAD s/p iliac stent placement in 06-2017 on Plavix: Appreciate cardiology recommendations - Holding Plavix at this time (cards recommends a 5-day washout period)  Acute kidney injury: Creatinine back to baseline after administration of IV fluids. - Continue to  monitor  DVT PPX: Lovenox 40 mg Subcu CODE STATUS: FULL  Dispo: Anticipated discharge in 4 to 7 days.  Carroll Sage, MD 01/07/2018, 7:29 AM Pager: [phone]

## 2018-01-07 NOTE — Progress Notes (Signed)
Internal Medicine Teaching Service Attending:   I saw and examined the patient. I reviewed the resident's note and I agree with the resident's findings and plan as documented in the resident's note.  Principal Problem:   Small bowel obstruction (HCC) Active Problems:   CAD (coronary artery disease) of artery bypass graft  72 year old man hospital day #2 with a small bowel obstruction which may be due to adhesions versus a polyp/mass near the ileocecal junction.  He has made some clinical improvement since admission, nausea and vomiting are improved, having flatus.  Last BM was Monday.  Has not needed NG tube decompression.  Electrolytes look good today, renal function is stable with a creatinine of 1.2.  Greatly appreciate surgery consultation, overall plan eventually is for surgery to remove this tubular adenoma near the appendiceal orifice.  If he continues to improve this surgery can be done electively as an outpatient.  We will continue to monitor him, slowly advance diet.  I agree with continuing to hold Plavix for now just in case we want to do surgery sooner.  Lalla Brothers, MD FACP

## 2018-01-07 NOTE — Progress Notes (Signed)
[facility]/Trauma Progress Note      Assessment/Plan DM HTN CAD s/p cardiac stent placement 06/2017 on plavix (last dose12/10) - cardiologist is Dr. Gerarda Gunther at [facility] in [place] H/o partial gastrectomy for ulcer disease in 1993 Polyp at/near appendiceal orifice polyp  SBO - Patient with SBO, possibly secondary to known polyp at/near appendiceal orifice - cardiac clearance for possible surgery - hold on NG tube as pt is having flatus and feeling better - xray this am looks like SBO is resolving, await official read  FEN: NPO, ice chips, IVF VTE: lovenox, holding plavix ID: none Follow up: TBD  Plan: holding on NGT and surgery at this time as pt is improving. Do not advance diet yet   LOS: 1 day    Subjective: CC: SBO  Pt states his pain is greatly improved. He started having flatus this am. No vomiting overnight. Wife at bedside. Less abdominal bloating.   Objective: Vital signs in last 24 hours: Temp:  [97.4 F (36.3 C)-98.3 F (36.8 C)] 97.9 F (36.6 C) (12/11 0548) Pulse Rate:  [66-83] 66 (12/11 0548) Resp:  [14-20] 16 (12/11 0548) BP: (117-138)/(75-87) 127/77 (12/11 0548) SpO2:  [93 %-100 %] 94 % (12/11 0548) Last BM Date: 01/05/18  Intake/Output from previous day: 12/10 0701 - 12/11 0700 In: 2100 [I.V.:1100; IV Piggyback:1000] Out: 850 [Urine:850] Intake/Output this shift: No intake/output data recorded.  PE: Gen:  Alert, NAD, pleasant, cooperative Pulm:  Rate and effort normal Abd: Soft, ND, +BS, mild TTP of lower abdomen without guarding, no peritonitis  Skin: no rashes noted, warm and dry   Anti-infectives: Anti-infectives (From admission, onward)   None      Lab Results:  Recent Labs    01/06/18 1245 01/07/18 0532  WBC 15.2* 8.4  HGB 15.1 12.6*  HCT 47.6 39.4  PLT 166 120*   BMET Recent Labs    01/06/18 1245 01/07/18 0532  NA 138 140  K 5.1 3.9  CL 103 106  CO2 22 26  GLUCOSE 188* 124*  BUN 24* 21   CREATININE 1.50* 1.19  CALCIUM 9.8 8.2*   PT/INR No results for input(s): LABPROT, INR in the last 72 hours. CMP     Component Value Date/Time   NA 140 01/07/2018 0532   K 3.9 01/07/2018 0532   CL 106 01/07/2018 0532   CO2 26 01/07/2018 0532   GLUCOSE 124 (H) 01/07/2018 0532   BUN 21 01/07/2018 0532   CREATININE 1.19 01/07/2018 0532   CALCIUM 8.2 (L) 01/07/2018 0532   PROT 5.5 (L) 01/07/2018 0532   ALBUMIN 3.3 (L) 01/07/2018 0532   AST 20 01/07/2018 0532   ALT 21 01/07/2018 0532   ALKPHOS 45 01/07/2018 0532   BILITOT 0.7 01/07/2018 0532   GFRNONAA >60 01/07/2018 0532   GFRAA >60 01/07/2018 0532   Lipase     Component Value Date/Time   LIPASE 64 (H) 01/06/2018 1245    Studies/Results: Ct Abdomen Pelvis W Contrast  Result Date: 01/06/2018 CLINICAL DATA:  Acute generalized abdominal pain. EXAM: CT ABDOMEN AND PELVIS WITH CONTRAST TECHNIQUE: Multidetector CT imaging of the abdomen and pelvis was performed using the standard protocol following bolus administration of intravenous contrast. CONTRAST:  163mL OMNIPAQUE IOHEXOL 300 MG/ML  SOLN COMPARISON:  CT scan of September 23, 2014. FINDINGS: Lower chest: No acute abnormality. Hepatobiliary: No focal liver abnormality is seen. No gallstones, gallbladder wall thickening, or biliary dilatation. Pancreas: Unremarkable. No pancreatic ductal dilatation or surrounding inflammatory changes. Spleen: Normal in size  without focal abnormality. Adrenals/Urinary Tract: Stable 1.5 cm left adrenal adenoma. Right adrenal gland is unremarkable. Bilateral renal cysts are noted. No hydronephrosis or renal obstruction is noted. Urinary bladder is unremarkable. Bladder is not well visualized due to scatter artifact arising from bilateral hip arthroplasties. Stomach/Bowel: Status post gastric surgery. The appendix appears normal. Diffuse small bowel dilatation is noted which extends to the ileocecal valve. Definite transition zone is not identified. No colonic  dilatation is noted. Vascular/Lymphatic: No significant vascular findings are present. No enlarged abdominal or pelvic lymph nodes. Reproductive: Prostate not well-visualized due to previously described scatter artifact from bilateral hip arthroplasties. Other: No abdominal wall hernia or abnormality. No abdominopelvic ascites. Musculoskeletal: Bilateral hip arthroplasties as previously mentioned. No acute osseous abnormality is noted. 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 IMPRESSION: Diffuse small bowel dilatation is noted without definite transition zone seen. This is concerning for distal small bowel obstruction although ileus cannot be excluded. Follow-up radiographs are recommended. No colonic dilatation is noted. Stable left adrenal adenoma. Electronically Signed   By: Marijo Conception, M.D.   On: 01/06/2018 15:32      Kalman Drape , [facility] 01/07/2018, 8:15 AM  Pager: [phone] Mon-Wed, Friday 7:00am-4:30pm Thurs 7am-11:30am  Consults: [phone]

## 2018-01-07 NOTE — Plan of Care (Signed)
  Problem: Education: Goal: Knowledge of General Education information will improve Description Including pain rating scale, medication(s)/side effects and non-pharmacologic comfort measures Outcome: Progressing Note:  POC and orders reviewed with pt.   

## 2018-01-07 NOTE — Plan of Care (Signed)

## 2018-01-08 ENCOUNTER — Inpatient Hospital Stay (HOSPITAL_COMMUNITY): Payer: Medicare HMO

## 2018-01-08 DIAGNOSIS — K635 Polyp of colon: Secondary | ICD-10-CM

## 2018-01-08 DIAGNOSIS — K56609 Unspecified intestinal obstruction, unspecified as to partial versus complete obstruction: Secondary | ICD-10-CM

## 2018-01-08 DIAGNOSIS — M25512 Pain in left shoulder: Secondary | ICD-10-CM

## 2018-01-08 DIAGNOSIS — G8929 Other chronic pain: Secondary | ICD-10-CM

## 2018-01-08 LAB — GLUCOSE, CAPILLARY
Glucose-Capillary: 136 mg/dL — ABNORMAL HIGH (ref 70–99)
Glucose-Capillary: 117 mg/dL — ABNORMAL HIGH (ref 70–99)
Glucose-Capillary: 80 mg/dL (ref 70–99)
Glucose-Capillary: 98 mg/dL (ref 70–99)

## 2018-01-08 LAB — BASIC METABOLIC PANEL
Anion gap: 10 (ref 5–15)
BUN: 10 mg/dL (ref 8–23)
CO2: 27 mmol/L (ref 22–32)
Calcium: 8.7 mg/dL — ABNORMAL LOW (ref 8.9–10.3)
Chloride: 104 mmol/L (ref 98–111)
Creatinine, Ser: 1.09 mg/dL (ref 0.61–1.24)
GFR calc Af Amer: >60 mL/min (ref >60)
GLUCOSE: 144 mg/dL — AB (ref 70–99)
Potassium: 3.6 mmol/L (ref 3.5–5.1)
Sodium: 141 mmol/L (ref 135–145)

## 2018-01-08 MED ORDER — HYDROMORPHONE HCL 1 MG/ML IJ SOLN: 1.0000 mg | INTRAMUSCULAR | Status: DC | PRN

## 2018-01-08 MED ORDER — BISACODYL 10 MG RE SUPP
10.0000 mg | Freq: Once | RECTAL | Status: AC
  Administered 2018-01-08: 10 mg via RECTAL
  Filled 2018-01-08: qty 1

## 2018-01-08 MED ORDER — KETOROLAC TROMETHAMINE 30 MG/ML IJ SOLN
30.0000 mg | Freq: Once | INTRAMUSCULAR | Status: AC
  Administered 2018-01-08: 30 mg via INTRAVENOUS
  Filled 2018-01-08: qty 1

## 2018-01-08 NOTE — Progress Notes (Signed)
   Subjective:   [name] states he feels better this morning. He denies abdominal pain, nausea, vomiting. He reports that he has been evaluated by the surgery team and they plan to advance his diet today. He has been ambulating with no difficulty. He does have pain in his left shoulder from a "pinched nerve" and is requesting pain medications. He normally takes oxycodone at home.   Objective:  Vital signs in last 24 hours: Vitals:   01/07/18 0548 01/07/18 2131 01/07/18 2140 01/08/18 0455  BP: 127/77 136/79  (!) 150/88  Pulse: 66 66  73  Resp: 16 17  16   Temp: 97.9 F (36.6 C) 98.8 F (37.1 C)  (!) 97.4 F (36.3 C)  TempSrc: Oral Oral  Oral  SpO2: 94% 93%  94%  Weight:   85 kg   Height:   5\' 10"  (1.778 m)    Physical Exam Vitals signs and nursing note reviewed.  Constitutional:      Appearance: He is well-developed and well-nourished.  Abdominal:     General: There is no distension.     Palpations: Abdomen is soft.     Tenderness: There is no abdominal tenderness.  Neurological:     Mental Status: He is alert.  Psychiatric:        Mood and Affect: Mood and affect normal.        Behavior: Behavior normal.     Assessment/Plan:  Principal Problem:   Small bowel obstruction (HCC) Active Problems:   CAD (coronary artery disease) of artery bypass graft   [name] is is admitted for small bowel obstruction likely caused by a known polyp at the appendiceal orifice.  He is being medically managed with improvement in his abdominal pain. No new nausea or vomiting this morning. Abdominal X-ray this morning shows resolution of small bowel distention. General surgery is following with tentative surgery planned for Monday (after 5 day wash-out period from Plavix).  Small bowel obstruction: - D5 half-normal saline at 100 mils per hour - Keep n.p.o. except for ice chips for now. Will discuss with surgery about advancing diet. - Discontinued Dilaudid as it can cause bowel  dysmotility - Zofran 4 mg every 6 hours PRN nausea - Pantoprazole 40 mg daily  CAD s/p iliac stent placement in 06-2017 on Plavix: Appreciate cardiology recommendations - Holding Plavix at this time (cards recommends a 5-day washout period)  History of cervical nerve impingement: Reports neck pain this morning. - Will give one dose of IV Toradol 30 mg  DVT PPX: Lovenox 40 mg Subcu CODE STATUS: FULL  Dispo: Anticipated discharge in 4 to 7 days.  Lance Sage, MD 01/08/2018, 6:58 AM Pager: [phone]

## 2018-01-08 NOTE — Progress Notes (Signed)
Internal Medicine Teaching Service Attending:   I saw and examined the patient. I reviewed the resident's note and I agree with the resident's findings and plan as documented in the resident's note.  Principal Problem:   Small bowel obstruction (HCC) Active Problems:   CAD (coronary artery disease) of artery bypass graft  72 year old man hospital day #3 with a small bowel obstruction.  Clinically he is making some improvement.  No bowel movement since Monday.  No nausea or vomiting yesterday.  Only feels slightly hungry today.  Abdomen exam is reassuring with no distention and no tenderness.  We are collaborating with general surgery, it still unclear if he will resolve this obstruction on his own and there is potential that we may pursue a surgical approach as early as Monday.  We will continue with supportive care today, advance his diet to sips of clear liquids and see how he does.  Blood sugars have been stable on very low doses of sliding scale insulin.  Lalla Brothers, MD FACP

## 2018-01-08 NOTE — Progress Notes (Signed)
Subjective/Chief Complaint: Reports only passing a small amount of flatus, no BM Feel bloated but no nausea and reports no pain   Objective: Vital signs in last 24 hours: Temp:  [97.4 F (36.3 C)-98.8 F (37.1 C)] 97.4 F (36.3 C) (12/12 0455) Pulse Rate:  [66-73] 73 (12/12 0455) Resp:  [16-17] 16 (12/12 0455) BP: (136-150)/(79-88) 150/88 (12/12 0455) SpO2:  [93 %-94 %] 94 % (12/12 0455) Weight:  [85 kg] 85 kg (12/11 2140) Last BM Date: 01/05/18  Intake/Output from previous day: 12/11 0701 - 12/12 0700 In: 2570.3 [P.O.:120; I.V.:2450.3] Out: -  Intake/Output this shift: No intake/output data recorded.  Exam: Abdomen still has some RLQ tenderness but is soft. Mildly full  Lab Results:  Recent Labs    01/06/18 1245 01/07/18 0532  WBC 15.2* 8.4  HGB 15.1 12.6*  HCT 47.6 39.4  PLT 166 120*   BMET Recent Labs    01/06/18 1245 01/07/18 0532  NA 138 140  K 5.1 3.9  CL 103 106  CO2 22 26  GLUCOSE 188* 124*  BUN 24* 21  CREATININE 1.50* 1.19  CALCIUM 9.8 8.2*   PT/INR No results for input(s): LABPROT, INR in the last 72 hours. ABG No results for input(s): PHART, HCO3 in the last 72 hours.  Invalid input(s): PCO2, PO2  Studies/Results: Ct Abdomen Pelvis W Contrast  Result Date: 01/06/2018 CLINICAL DATA:  Acute generalized abdominal pain. EXAM: CT ABDOMEN AND PELVIS WITH CONTRAST TECHNIQUE: Multidetector CT imaging of the abdomen and pelvis was performed using the standard protocol following bolus administration of intravenous contrast. CONTRAST:  138mL OMNIPAQUE IOHEXOL 300 MG/ML  SOLN COMPARISON:  CT scan of September 23, 2014. FINDINGS: Lower chest: No acute abnormality. Hepatobiliary: No focal liver abnormality is seen. No gallstones, gallbladder wall thickening, or biliary dilatation. Pancreas: Unremarkable. No pancreatic ductal dilatation or surrounding inflammatory changes. Spleen: Normal in size without focal abnormality. Adrenals/Urinary Tract: Stable  1.5 cm left adrenal adenoma. Right adrenal gland is unremarkable. Bilateral renal cysts are noted. No hydronephrosis or renal obstruction is noted. Urinary bladder is unremarkable. Bladder is not well visualized due to scatter artifact arising from bilateral hip arthroplasties. Stomach/Bowel: Status post gastric surgery. The appendix appears normal. Diffuse small bowel dilatation is noted which extends to the ileocecal valve. Definite transition zone is not identified. No colonic dilatation is noted. Vascular/Lymphatic: No significant vascular findings are present. No enlarged abdominal or pelvic lymph nodes. Reproductive: Prostate not well-visualized due to previously described scatter artifact from bilateral hip arthroplasties. Other: No abdominal wall hernia or abnormality. No abdominopelvic ascites. Musculoskeletal: Bilateral hip arthroplasties as previously mentioned. No acute osseous abnormality is noted. 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 IMPRESSION: Diffuse small bowel dilatation is noted without definite transition zone seen. This is concerning for distal small bowel obstruction although ileus cannot be excluded. Follow-up radiographs are recommended. No colonic dilatation is noted. Stable left adrenal adenoma. Electronically Signed   By: Marijo Conception, M.D.   On: 01/06/2018 15:32   Dg Abd Portable 1v  Result Date: 01/07/2018 CLINICAL DATA:  Small bowel obstruction EXAM: PORTABLE ABDOMEN - 1 VIEW COMPARISON:  CT 01/06/2018 FINDINGS: Mildly prominent right abdominal small bowel loops noted. Overall, small bowel prominence appears slightly improved. No organomegaly or visible free air. IMPRESSION: Mildly prominent right abdominal small bowel loop with overall improvement in small bowel gas pattern. Electronically Signed   By: Rolm Baptise M.D.   On: 01/07/2018 09:34    Anti-infectives: Anti-infectives (  From admission, onward)   None       Assessment/Plan:  SBO  Still not significantly improving Will repeat abdominal xray this morning to look at bowel gas pattern Will also try a suppository. Without much improvement, heading toward potential surgery which would be probably Monday allowing time for plavix to wear off.  LOS: 2 days    [name] A 01/08/2018

## 2018-01-09 DIAGNOSIS — G588 Other specified mononeuropathies: Secondary | ICD-10-CM

## 2018-01-09 LAB — CBC WITH DIFFERENTIAL/PLATELET
Abs Immature Granulocytes: 0.03 10*3/uL (ref 0.00–0.07)
BASOS PCT: 1 %
Basophils Absolute: 0 10*3/uL (ref 0.0–0.1)
Eosinophils Absolute: 0.2 10*3/uL (ref 0.0–0.5)
Eosinophils Relative: 3 %
HCT: 44.4 % (ref 39.0–52.0)
Hemoglobin: 14.1 g/dL (ref 13.0–17.0)
Immature Granulocytes: 1 %
Lymphocytes Relative: 31 %
Lymphs Abs: 1.8 10*3/uL (ref 0.7–4.0)
MCH: 28.7 pg (ref 26.0–34.0)
MCHC: 31.8 g/dL (ref 30.0–36.0)
MCV: 90.2 fL (ref 80.0–100.0)
Monocytes Absolute: 0.5 10*3/uL (ref 0.1–1.0)
Monocytes Relative: 9 %
Neutro Abs: 3.2 10*3/uL (ref 1.7–7.7)
Neutrophils Relative %: 55 %
Platelets: 138 10*3/uL — ABNORMAL LOW (ref 150–400)
RBC: 4.92 MIL/uL (ref 4.22–5.81)
RDW: 12.7 % (ref 11.5–15.5)
WBC: 5.7 10*3/uL (ref 4.0–10.5)
nRBC: 0 % (ref 0.0–0.2)

## 2018-01-09 LAB — BASIC METABOLIC PANEL
Anion gap: 11 (ref 5–15)
BUN: 12 mg/dL (ref 8–23)
CO2: 29 mmol/L (ref 22–32)
Calcium: 9.3 mg/dL (ref 8.9–10.3)
Chloride: 103 mmol/L (ref 98–111)
Creatinine, Ser: 1.2 mg/dL (ref 0.61–1.24)
GFR calc Af Amer: >60 mL/min (ref >60)
GFR calc non Af Amer: >60 mL/min (ref >60)
Glucose, Bld: 119 mg/dL — ABNORMAL HIGH (ref 70–99)
Potassium: 4 mmol/L (ref 3.5–5.1)
Sodium: 143 mmol/L (ref 135–145)

## 2018-01-09 LAB — GLUCOSE, CAPILLARY
GLUCOSE-CAPILLARY: 94 mg/dL (ref 70–99)
Glucose-Capillary: 101 mg/dL — ABNORMAL HIGH (ref 70–99)
Glucose-Capillary: 129 mg/dL — ABNORMAL HIGH (ref 70–99)
Glucose-Capillary: 86 mg/dL (ref 70–99)

## 2018-01-09 MED ORDER — AMLODIPINE BESYLATE 5 MG PO TABS
5.0000 mg | ORAL_TABLET | Freq: Every day | ORAL | Status: DC
  Administered 2018-01-09 – 2018-01-17 (×8): 5 mg via ORAL
  Filled 2018-01-09 (×8): qty 1

## 2018-01-09 MED ORDER — KETOROLAC TROMETHAMINE 15 MG/ML IJ SOLN: 30.0000 mg | Freq: Four times a day (QID) | INTRAMUSCULAR | Status: DC | PRN

## 2018-01-09 MED ORDER — KETOROLAC TROMETHAMINE 30 MG/ML IJ SOLN
30.0000 mg | Freq: Four times a day (QID) | INTRAMUSCULAR | Status: DC | PRN
  Administered 2018-01-09 – 2018-01-12 (×5): 30 mg via INTRAVENOUS
  Filled 2018-01-09 (×5): qty 1

## 2018-01-09 MED ORDER — CARVEDILOL 3.125 MG PO TABS
3.1200 mg | ORAL_TABLET | Freq: Two times a day (BID) | ORAL | Status: DC
  Administered 2018-01-09: 3.125 mg via ORAL
  Administered 2018-01-10 – 2018-01-11 (×4): 3.12 mg via ORAL
  Administered 2018-01-12: 3.125 mg via ORAL
  Administered 2018-01-12 – 2018-01-17 (×11): 3.12 mg via ORAL
  Administered 2018-01-18 – 2018-01-19 (×3): 3.125 mg via ORAL
  Filled 2018-01-09 (×20): qty 1

## 2018-01-09 MED ORDER — DIATRIZOATE MEGLUMINE & SODIUM 66-10 % PO SOLN
90.0000 mL | Freq: Once | ORAL | Status: AC
  Administered 2018-01-09: 90 mL via NASOGASTRIC
  Filled 2018-01-09 (×2): qty 90

## 2018-01-09 NOTE — Progress Notes (Addendum)
   Subjective: Mr. Ardis denies any nausea and vomiting.  He had a very small bowel movement yesterday.  He has otherwise not had a bowel movement.  He denies any abdominal pain.  He is tolerating a full liquid diet.  Objective:  Vital signs in last 24 hours: Vitals:   01/08/18 0455 01/08/18 1400 01/08/18 2205 01/09/18 0452  BP: (!) 150/88 (!) 148/80 (!) 150/85 (!) 144/81  Pulse: 73 76 70 71  Resp: 16 18 18 18   Temp: (!) 97.4 F (36.3 C) 97.6 F (36.4 C) 97.7 F (36.5 C) 97.9 F (36.6 C)  TempSrc: Oral Oral Oral Oral  SpO2: 94% 96% 95% 96%  Weight:      Height:       Physical Exam Vitals signs and nursing note reviewed.  Constitutional:      Appearance: He is well-developed and well-nourished.  Abdominal:     General: There is no distension.     Palpations: Abdomen is soft.     Tenderness: There is no abdominal tenderness.  Psychiatric:        Mood and Affect: Mood and affect normal.        Behavior: Behavior normal.     Assessment/Plan:  Principal Problem:   Small bowel obstruction (HCC) Active Problems:   CAD (coronary artery disease) of artery bypass graft   Mr. [name] admitted for small bowel obstruction.  He is being medically managed at this time with improvement in his symptoms.  His symptoms have greatly improved.  He is no longer having nausea or vomiting and had one very small bowel movement yesterday.  Additionally abdominal x-ray performed yesterday showed complete resolution of his SBO.  General surgery is following with tentative surgery planned for Monday after a 5-day washout.  Small bowel obstruction: -On full liquid diet.  Will advance as tolerated. - Zofran 4 mg every 6 hours PRN nausea - Pantoprazole 40 mg daily  CADs/piliac stent placement in 06-2017 on Plavix: - Holding Plavix at this time(cards recommends a 5-day washoutperiod)  History of cervical nerve impingement: Neck pain improved with IV Toradol.  He did state that his neck  pain has returned. - Will start IV Toradol 30 mg every 6 hours PRN.  Hypertension: SBP in the 150's. Will add back carvedilol 3.12 mg BID and amlodipine 10 mg QD now that he is eating.  Will hold off on adding other home med lisinopril 20 mg QD. - Start carvedilol and amlodipine today  DVT XVQ:MGQQPYP 40 mg Subcu CODE STATUS: FULL FEN GI: Full liquid diet  Dispo: Anticipated dischargein 3 to 7 days.  Carroll Sage, MD 01/09/2018, 6:55 AM Pager: [phone]

## 2018-01-09 NOTE — Care Management Important Message (Signed)
Important Message  Patient Details  Name: [name] MRN: [id number] Date of Birth: [birth year]   Medicare Important Message Given:  Yes    Orbie Pyo 01/09/2018, 3:45 PM

## 2018-01-09 NOTE — Progress Notes (Signed)
Notified radiology that pt drank the gastrograffin at 5pm.  Portable x ray due at 1am.

## 2018-01-09 NOTE — Progress Notes (Signed)
[facility]/Trauma Progress Note      Assessment/Plan DM HTN CAD s/p cardiac stent placement 06/2017 on plavix (last dose12/10) - cardiologist is Dr. 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   LOS: 3 days    Subjective: CC: SBO  Tolerating clears. Had 2 small BM's yesterday. Still having flatus. No issues overnight.   Objective: Vital signs in last 24 hours: Temp:  [97.6 F (36.4 C)-97.9 F (36.6 C)] 97.9 F (36.6 C) (12/13 0452) Pulse Rate:  [70-76] 71 (12/13 0452) Resp:  [18] 18 (12/13 0452) BP: (144-150)/(80-85) 144/81 (12/13 0452) SpO2:  [95 %-96 %] 96 % (12/13 0452) Last BM Date: 01/08/18  Intake/Output from previous day: 12/12 0701 - 12/13 0700 In: 1506.6 [P.O.:700; I.V.:806.6] Out: 2 [Stool:2] Intake/Output this shift: No intake/output data recorded.  PE: Gen:  Alert, NAD, pleasant, cooperative Pulm:  Rate and effort normal Abd: Soft, ND, +BS, no TTP, no peritonitis  Skin: no rashes noted, warm and dry   Anti-infectives: Anti-infectives (From admission, onward)   None      Lab Results:  Recent Labs    01/06/18 1245 01/07/18 0532  WBC 15.2* 8.4  HGB 15.1 12.6*  HCT 47.6 39.4  PLT 166 120*   BMET Recent Labs    01/07/18 0532 01/08/18 0720  NA 140 141  K 3.9 3.6  CL 106 104  CO2 26 27  GLUCOSE 124* 144*  BUN 21 10  CREATININE 1.19 1.09  CALCIUM 8.2* 8.7*   PT/INR No results for input(s): LABPROT, INR in the last 72 hours. CMP     Component Value Date/Time   NA 141 01/08/2018 0720   K 3.6 01/08/2018  0720   CL 104 01/08/2018 0720   CO2 27 01/08/2018 0720   GLUCOSE 144 (H) 01/08/2018 0720   BUN 10 01/08/2018 0720   CREATININE 1.09 01/08/2018 0720   CALCIUM 8.7 (L) 01/08/2018 0720   PROT 5.5 (L) 01/07/2018 0532   ALBUMIN 3.3 (L) 01/07/2018 0532   AST 20 01/07/2018 0532   ALT 21 01/07/2018 0532   ALKPHOS 45 01/07/2018 0532   BILITOT 0.7 01/07/2018 0532   GFRNONAA >60 01/08/2018 0720   GFRAA >60 01/08/2018 0720   Lipase     Component Value Date/Time   LIPASE 64 (H) 01/06/2018 1245    Studies/Results: Dg Abd 2 Views  Result Date: 01/08/2018 CLINICAL DATA:  Follow-up distal small bowel obstruction versus generalized small bowel ileus. EXAM: ABDOMEN - 2 VIEW COMPARISON:  KUB yesterday. CT abdomen and pelvis 01/06/2018 and earlier. FINDINGS: Resolution of the diffuse small bowel distention since the CT 2 days ago. Bowel gas pattern now normal. Postsurgical changes in the upper abdomen presumably related to prior partial gastrectomy and Billroth 1 anastomosis based on the appearance on the prior CT. No visible opaque urinary tract calculi. Numerous pelvic phleboliths. Degenerative changes involving the lumbar spine. Prior BILATERAL hip arthroplasties with anatomic alignment. IMPRESSION: Resolution of small bowel distention since the CT 2 days ago. No acute abdominal abnormality currently. Electronically Signed   By: Evangeline Dakin M.D.   On: 01/08/2018 10:12  Kalman Drape , [facility] 01/09/2018, 8:18 AM  Pager: [phone] Mon-Wed, Friday 7:00am-4:30pm Thurs 7am-11:30am  Consults: [phone]

## 2018-01-09 NOTE — Progress Notes (Signed)
Internal Medicine Teaching Service Attending:   I saw and examined the patient. I reviewed the resident's note and I agree with the resident's findings and plan as documented in the resident's note.  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  Clinically doing fairly well, had to clear liquid meals yesterday without any nausea or vomiting.  Still not hungry today.  Had some gas and small stool output yesterday, not a significant BM since Monday.  Still working on the fence about whether to pursue surgery on Monday or continue supportive care and work toward surgery in March.  I do wonder if he will be high risk for a recurrent obstruction if we do not surgically manage this during this admission.  Will collaborate with the general surgeon.  Overall patient is doing well today.  Probably could tolerate advancing his diet, continue ambulation.  Lalla Brothers, MD FACP

## 2018-01-10 ENCOUNTER — Inpatient Hospital Stay (HOSPITAL_COMMUNITY): Payer: Medicare HMO

## 2018-01-10 LAB — GLUCOSE, CAPILLARY
GLUCOSE-CAPILLARY: 127 mg/dL — AB (ref 70–99)
Glucose-Capillary: 87 mg/dL (ref 70–99)
Glucose-Capillary: 80 mg/dL (ref 70–99)

## 2018-01-10 MED ORDER — SODIUM CHLORIDE 0.9 % IV SOLN
2.0000 g | INTRAVENOUS | Status: DC
  Filled 2018-01-10: qty 2

## 2018-01-10 MED ORDER — CHLORHEXIDINE GLUCONATE CLOTH 2 % EX PADS: 6.0000 | MEDICATED_PAD | Freq: Once | CUTANEOUS | Status: DC

## 2018-01-10 MED ORDER — LISINOPRIL 20 MG PO TABS
20.0000 mg | ORAL_TABLET | Freq: Every day | ORAL | Status: DC
  Administered 2018-01-10 – 2018-01-17 (×7): 20 mg via ORAL
  Filled 2018-01-10 (×7): qty 1

## 2018-01-10 MED ORDER — ALVIMOPAN 12 MG PO CAPS
12.0000 mg | ORAL_CAPSULE | ORAL | Status: AC
  Filled 2018-01-10: qty 1

## 2018-01-10 MED ORDER — SODIUM CHLORIDE 0.9 % IV SOLN
2.0000 g | INTRAVENOUS | Status: AC
  Filled 2018-01-10: qty 2

## 2018-01-10 MED ORDER — SODIUM CHLORIDE 0.9 % IV SOLN: 2.0000 g | INTRAVENOUS | Status: DC

## 2018-01-10 NOTE — Progress Notes (Signed)
Subjective/Chief Complaint: Nausea Patient doing well.  Bowels are moving.  No nausea or vomiting currently this morning since that is resolved.  I reviewed his CT scan and colonoscopic findings.  He was scheduled for an outpatient ileocecectomy but given his inpatient status and the fact has had intermittent bowel obstructions I discussed possible surgery Monday.  He is in agreement to proceed with ileocecectomy on Monday.  I discussed the procedure as well as the laparoscopic approach and possible open approach due to previous abdominal surgery.  He is in agreement to proceed.   Objective: Vital signs in last 24 hours: Temp:  [97.7 F (36.5 C)-98.7 F (37.1 C)] 97.7 F (36.5 C) (12/14 0432) Pulse Rate:  [73-85] 73 (12/14 0432) Resp:  [18-20] 18 (12/14 0432) BP: (136-158)/(87-95) 136/91 (12/14 0432) SpO2:  [96 %-98 %] 96 % (12/14 0432) Last BM Date: 01/09/18  Intake/Output from previous day: 12/13 0701 - 12/14 0700 In: 1440 [P.O.:1440] Out: -  Intake/Output this shift: No intake/output data recorded.  General appearance: alert and cooperative Head: Normocephalic, without obvious abnormality, atraumatic GI: Upper midline scar noted.  Soft nontender without distention, rebound or guarding.  Lab Results:  Recent Labs    01/09/18 0820  WBC 5.7  HGB 14.1  HCT 44.4  PLT 138*   BMET Recent Labs    01/08/18 0720 01/09/18 0820  NA 141 143  K 3.6 4.0  CL 104 103  CO2 27 29  GLUCOSE 144* 119*  BUN 10 12  CREATININE 1.09 1.20  CALCIUM 8.7* 9.3   PT/INR No results for input(s): LABPROT, INR in the last 72 hours. ABG No results for input(s): PHART, HCO3 in the last 72 hours.  Invalid input(s): PCO2, PO2  Studies/Results: Dg Abd 2 Views  Result Date: 01/08/2018 CLINICAL DATA:  Follow-up distal small bowel obstruction versus generalized small bowel ileus. EXAM: ABDOMEN - 2 VIEW COMPARISON:  KUB yesterday. CT abdomen and pelvis 01/06/2018 and earlier. FINDINGS:  Resolution of the diffuse small bowel distention since the CT 2 days ago. Bowel gas pattern now normal. Postsurgical changes in the upper abdomen presumably related to prior partial gastrectomy and Billroth 1 anastomosis based on the appearance on the prior CT. No visible opaque urinary tract calculi. Numerous pelvic phleboliths. Degenerative changes involving the lumbar spine. Prior BILATERAL hip arthroplasties with anatomic alignment. IMPRESSION: Resolution of small bowel distention since the CT 2 days ago. No acute abdominal abnormality currently. Electronically Signed   By: Evangeline Dakin M.D.   On: 01/08/2018 10:12   Dg Abd Portable 1v-small Bowel Obstruction Protocol-initial, 8 Hr Delay  Result Date: 01/10/2018 CLINICAL DATA:  Small bowel obstruction, 8 hour delayed film. EXAM: PORTABLE ABDOMEN - 1 VIEW COMPARISON:  Radiographs 01/08/2018, CT 01/06/2018 FINDINGS: Administered enteric contrast is seen in the ascending, transverse, descending and rectosigmoid colon. Few prominent air-filled small bowel loops centrally. Calcifications in the pelvis consistent phleboliths. Surgical clips and enteric chain sutures in the upper abdomen. IMPRESSION: Administered enteric contrast is seen throughout the colon. Electronically Signed   By: Keith Rake M.D.   On: 01/10/2018 02:20    Anti-infectives: Anti-infectives (From admission, onward)   None      Assessment/Plan: DM HTN CAD s/p cardiac stent placement 06/2017 on plavix (last dose12/10) - cardiologist is Dr. 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  Feel that getting this done while as an inpatient will facilitate  this since this needs to be done.  He is now off Plavix and will plan on surgery for Monday.  Begin bowel preparation tomorrow.  May have clear liquids until midnight tomorrow night.  The procedure was  discussed with the patient.  Laparoscopic partial colectomy discussed with the patient as well as non operative treatments. The risks of operative management include bleeding,  Infection,  Leak of anastamosis,  Ostomy formation, open procedure,  Sepsis,  Abcess,  Hernia,  DVT,  Pulmonary complications,  Cardiovascular  complications,  Injury to ureter,  Bladder,kidney,and anesthesia risks,  And death. The patient understands.  Questions answered.   The success of the procedure is 50-100  % for treating the patients symptoms. They agree to proceed.   FEN: CLD VTE: lovenox, holding plavix ID: none   LOS: 4 days    [name] [name] 01/10/2018

## 2018-01-10 NOTE — Progress Notes (Addendum)
   Subjective: [name] states that he is overall doing well.  He had one loose bowel movement this morning and has minimal abdominal pain.  He has tolerated a clear liquid diet.  Objective:  Vital signs in last 24 hours: Vitals:   01/09/18 0452 01/09/18 1400 01/09/18 2115 01/10/18 0432  BP: (!) 144/81 (!) 158/95 (!) 153/87 (!) 136/91  Pulse: 71 85 75 73  Resp: 18 20 18 18   Temp: 97.9 F (36.6 C) 98.7 F (37.1 C) 98 F (36.7 C) 97.7 F (36.5 C)  TempSrc: Oral Oral Oral   SpO2: 96% 98% 96% 96%  Weight:      Height:       Physical Exam Vitals signs and nursing note reviewed.  Constitutional:      Appearance: He is well-developed and well-nourished.  Abdominal:     General: There is no distension.     Palpations: Abdomen is soft.     Tenderness: There is no abdominal tenderness.  Neurological:     Mental Status: He is alert.  Psychiatric:        Mood and Affect: Mood and affect normal.        Behavior: Behavior normal.     Assessment/Plan:  Principal Problem:   Small bowel obstruction (HCC) Active Problems:   CAD (coronary artery disease) of artery bypass graft  [name] admitted for small bowel obstruction which has significantly improved.  He was scheduled for an outpatient ileocolectomy but surgery has decided to proceed with the procedure on Monday given his inpatient status and high risk of SBO recurrence.   We will continue holding his Plavix and he will remain on a clear liquid diet until tomorrow night.  He will begin a bowel preparation tomorrow as well.  Small bowel obstruction: - On  clear liquid diet.  N.p.o. tomorrow at midnight. - Zofran 4 mg every 6 hours PRN nausea - Pantoprazole 40 mg daily  CADs/piliac stent placement in 06-2017 on Plavix: - Continue holding Plavix in preparation for surgery.  History of cervical nerve impingement: Neck pain improved with IV Toradol. -Continue IV Toradol 30 mg every 6 hours PRN.  Hypertension:  He  continues to have elevated blood pressures while on home carvedilol and amlodipine.  We will add back his lisinopril 20 mg daily today.   - Continue carvedilol 3.12 mg twice daily - Continue amlodipine 10 mg daily - Start lisinopril 20 mg daily  DVT [id number]:QIONGEX 40 mg Subcu CODE STATUS: FULL FEN GI:  Clear liquid diet.  N.p.o. tomorrow at midnight.  Dispo: Anticipated dischargewithin 2 to 3 days after surgery on Monday 01/12/18.  Carroll Sage, MD 01/10/2018, 7:20 AM Pager: [phone]

## 2018-01-11 DIAGNOSIS — Z7902 Long term (current) use of antithrombotics/antiplatelets: Secondary | ICD-10-CM

## 2018-01-11 DIAGNOSIS — Z79899 Other long term (current) drug therapy: Secondary | ICD-10-CM

## 2018-01-11 DIAGNOSIS — Z8739 Personal history of other diseases of the musculoskeletal system and connective tissue: Secondary | ICD-10-CM

## 2018-01-11 DIAGNOSIS — Z955 Presence of coronary angioplasty implant and graft: Secondary | ICD-10-CM

## 2018-01-11 DIAGNOSIS — I10 Essential (primary) hypertension: Secondary | ICD-10-CM

## 2018-01-11 LAB — SURGICAL PCR SCREEN
MRSA, PCR: NEGATIVE
Staphylococcus aureus: NEGATIVE

## 2018-01-11 LAB — HEMOGLOBIN A1C
Hgb A1c MFr Bld: 6.5 % — ABNORMAL HIGH (ref 4.8–5.6)
MEAN PLASMA GLUCOSE: 139.85 mg/dL

## 2018-01-11 LAB — GLUCOSE, CAPILLARY
Glucose-Capillary: 89 mg/dL (ref 70–99)
Glucose-Capillary: 153 mg/dL — ABNORMAL HIGH (ref 70–99)

## 2018-01-11 MED ORDER — NEOMYCIN SULFATE 500 MG PO TABS
1000.0000 mg | ORAL_TABLET | Freq: Four times a day (QID) | ORAL | Status: DC
  Administered 2018-01-12 – 2018-01-17 (×21): 1000 mg via ORAL
  Filled 2018-01-11 (×22): qty 2

## 2018-01-11 MED ORDER — BISACODYL 5 MG PO TBEC
20.0000 mg | DELAYED_RELEASE_TABLET | Freq: Once | ORAL | Status: AC
  Administered 2018-01-11: 20 mg via ORAL
  Filled 2018-01-11: qty 4

## 2018-01-11 MED ORDER — POLYETHYLENE GLYCOL 3350 17 GM/SCOOP PO POWD
1.0000 | Freq: Once | ORAL | Status: AC
  Administered 2018-01-11: 238 g via ORAL
  Filled 2018-01-11 (×2): qty 255

## 2018-01-11 MED ORDER — METRONIDAZOLE 500 MG PO TABS: 1000.0000 mg | ORAL_TABLET | Freq: Four times a day (QID) | ORAL | Status: DC

## 2018-01-11 NOTE — Progress Notes (Signed)
Subjective/Chief Complaint: Feels good Patient denies abdominal pain nausea or vomiting.  He will begin his bowel prep today for surgery tomorrow.  I reviewed the procedure as well as the pros and cons of surgery as well as nonoperative management.  He desires to proceed with partial colectomy tomorrow.   Objective: Vital signs in last 24 hours: Temp:  [97.7 F (36.5 C)-98.1 F (36.7 C)] 97.7 F (36.5 C) (12/15 0525) Pulse Rate:  [60-73] 60 (12/15 0525) Resp:  [18] 18 (12/14 1505) BP: (125-160)/(81-94) 125/81 (12/15 0525) SpO2:  [95 %-98 %] 95 % (12/15 0525) Last BM Date: 01/10/18  Intake/Output from previous day: 12/14 0701 - 12/15 0700 In: 720 [P.O.:720] Out: -  Intake/Output this shift: No intake/output data recorded.  General appearance: alert and cooperative Head: Normocephalic, without obvious abnormality, atraumatic Resp: clear to auscultation bilaterally GI: soft, non-tender; bowel sounds normal; no masses,  no organomegaly  Lab Results:  Recent Labs    01/09/18 0820  WBC 5.7  HGB 14.1  HCT 44.4  PLT 138*   BMET Recent Labs    01/09/18 0820  NA 143  K 4.0  CL 103  CO2 29  GLUCOSE 119*  BUN 12  CREATININE 1.20  CALCIUM 9.3   PT/INR No results for input(s): LABPROT, INR in the last 72 hours. ABG No results for input(s): PHART, HCO3 in the last 72 hours.  Invalid input(s): PCO2, PO2  Studies/Results: Dg Abd Portable 1v-small Bowel Obstruction Protocol-initial, 8 Hr Delay  Result Date: 01/10/2018 CLINICAL DATA:  Small bowel obstruction, 8 hour delayed film. EXAM: PORTABLE ABDOMEN - 1 VIEW COMPARISON:  Radiographs 01/08/2018, CT 01/06/2018 FINDINGS: Administered enteric contrast is seen in the ascending, transverse, descending and rectosigmoid colon. Few prominent air-filled small bowel loops centrally. Calcifications in the pelvis consistent phleboliths. Surgical clips and enteric chain sutures in the upper abdomen. IMPRESSION: Administered  enteric contrast is seen throughout the colon. Electronically Signed   By: Keith Rake M.D.   On: 01/10/2018 02:20    Anti-infectives: Anti-infectives (From admission, onward)   Start     Dose/Rate Route Frequency Ordered Stop   01/11/18 0600  cefoTEtan (CEFOTAN) 2 g in sodium chloride 0.9 % 100 mL IVPB  Status:  Discontinued     2 g 200 mL/hr over 30 Minutes Intravenous On call to O.R. 01/10/18 1828 01/10/18 1912   01/11/18 0600  cefoTEtan (CEFOTAN) 2 g in sodium chloride 0.9 % 100 mL IVPB  Status:  Discontinued     2 g 200 mL/hr over 30 Minutes Intravenous On call to O.R. 01/10/18 1912 01/10/18 1914   01/11/18 0600  cefoTEtan (CEFOTAN) 2 g in sodium chloride 0.9 % 100 mL IVPB     2 g 200 mL/hr over 30 Minutes Intravenous On call to O.R. 01/10/18 1915 01/12/18 0559      Assessment/Plan:  DM HTN CAD s/p cardiac stent placement 06/2017 on plavix (last dose12/10) - cardiologist is Dr. 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  Feel that getting this done while as an inpatient will facilitate this since this needs to be done.  He is now off Plavix and will plan on surgery for Monday.  Begin bowel preparation tomorrow.  May have clear liquids until midnight tomorrow night.  The procedure was discussed with the patient.  Laparoscopic partial colectomy discussed with the patient as well as non operative treatments. The risks  of operative management include bleeding,  Infection,  Leak of anastamosis,  Ostomy formation, open procedure,  Sepsis,  Abcess,  Hernia,  DVT,  Pulmonary complications,  Cardiovascular  complications,  Injury to ureter,  Bladder,kidney,and anesthesia risks,  And death. The patient understands.  Questions answered.   The success of the procedure is 50-100  % for treating the patients symptoms. They agree to proceed.   LOS: 5 days     Joyice Faster Dinnis Rog 01/11/2018

## 2018-01-11 NOTE — H&P (View-Only) (Signed)
Subjective/Chief Complaint: Feels good Patient denies abdominal pain nausea or vomiting.  He will begin his bowel prep today for surgery tomorrow.  I reviewed the procedure as well as the pros and cons of surgery as well as nonoperative management.  He desires to proceed with partial colectomy tomorrow.   Objective: Vital signs in last 24 hours: Temp:  [97.7 F (36.5 C)-98.1 F (36.7 C)] 97.7 F (36.5 C) (12/15 0525) Pulse Rate:  [60-73] 60 (12/15 0525) Resp:  [18] 18 (12/14 1505) BP: (125-160)/(81-94) 125/81 (12/15 0525) SpO2:  [95 %-98 %] 95 % (12/15 0525) Last BM Date: 01/10/18  Intake/Output from previous day: 12/14 0701 - 12/15 0700 In: 720 [P.O.:720] Out: -  Intake/Output this shift: No intake/output data recorded.  General appearance: alert and cooperative Head: Normocephalic, without obvious abnormality, atraumatic Resp: clear to auscultation bilaterally GI: soft, non-tender; bowel sounds normal; no masses,  no organomegaly  Lab Results:  Recent Labs    01/09/18 0820  WBC 5.7  HGB 14.1  HCT 44.4  PLT 138*   BMET Recent Labs    01/09/18 0820  NA 143  K 4.0  CL 103  CO2 29  GLUCOSE 119*  BUN 12  CREATININE 1.20  CALCIUM 9.3   PT/INR No results for input(s): LABPROT, INR in the last 72 hours. ABG No results for input(s): PHART, HCO3 in the last 72 hours.  Invalid input(s): PCO2, PO2  Studies/Results: Dg Abd Portable 1v-small Bowel Obstruction Protocol-initial, 8 Hr Delay  Result Date: 01/10/2018 CLINICAL DATA:  Small bowel obstruction, 8 hour delayed film. EXAM: PORTABLE ABDOMEN - 1 VIEW COMPARISON:  Radiographs 01/08/2018, CT 01/06/2018 FINDINGS: Administered enteric contrast is seen in the ascending, transverse, descending and rectosigmoid colon. Few prominent air-filled small bowel loops centrally. Calcifications in the pelvis consistent phleboliths. Surgical clips and enteric chain sutures in the upper abdomen. IMPRESSION: Administered  enteric contrast is seen throughout the colon. Electronically Signed   By: Keith Rake M.D.   On: 01/10/2018 02:20    Anti-infectives: Anti-infectives (From admission, onward)   Start     Dose/Rate Route Frequency Ordered Stop   01/11/18 0600  cefoTEtan (CEFOTAN) 2 g in sodium chloride 0.9 % 100 mL IVPB  Status:  Discontinued     2 g 200 mL/hr over 30 Minutes Intravenous On call to O.R. 01/10/18 1828 01/10/18 1912   01/11/18 0600  cefoTEtan (CEFOTAN) 2 g in sodium chloride 0.9 % 100 mL IVPB  Status:  Discontinued     2 g 200 mL/hr over 30 Minutes Intravenous On call to O.R. 01/10/18 1912 01/10/18 1914   01/11/18 0600  cefoTEtan (CEFOTAN) 2 g in sodium chloride 0.9 % 100 mL IVPB     2 g 200 mL/hr over 30 Minutes Intravenous On call to O.R. 01/10/18 1915 01/12/18 0559      Assessment/Plan:  DM HTN CAD s/p cardiac stent placement 06/2017 on plavix (last dose12/10) - cardiologist is Dr. 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  Feel that getting this done while as an inpatient will facilitate this since this needs to be done.  He is now off Plavix and will plan on surgery for Monday.  Begin bowel preparation tomorrow.  May have clear liquids until midnight tomorrow night.  The procedure was discussed with the patient.  Laparoscopic partial colectomy discussed with the patient as well as non operative treatments. The risks  of operative management include bleeding,  Infection,  Leak of anastamosis,  Ostomy formation, open procedure,  Sepsis,  Abcess,  Hernia,  DVT,  Pulmonary complications,  Cardiovascular  complications,  Injury to ureter,  Bladder,kidney,and anesthesia risks,  And death. The patient understands.  Questions answered.   The success of the procedure is 50-100  % for treating the patients symptoms. They agree to proceed.   LOS: 5 days     [name] 01/11/2018

## 2018-01-11 NOTE — Progress Notes (Signed)
Doctor Stark Klein  From [place] [facility] called me back with verbal order to give both 4 dulcolax tablet together with miralax now,  2 neomycin 500mg  tablet and 2 metronidazole 500mg  tablet midnight and at 6am, order read back to her for clearification

## 2018-01-11 NOTE — Anesthesia Preprocedure Evaluation (Addendum)
Anesthesia Evaluation  Patient identified by MRN, date of birth, ID band Patient awake    Reviewed: Allergy & Precautions, NPO status , Patient's Chart, lab work & pertinent test results  History of Anesthesia Complications Negative for: history of anesthetic complications  Airway Mallampati: II  TM Distance: >3 FB Neck ROM: Full    Dental no notable dental hx. (+) Dental Advisory Given   Pulmonary neg pulmonary ROS,    Pulmonary exam normal        Cardiovascular hypertension, Pt. on medications (-) angina+ CAD and + Cardiac Stents  Normal cardiovascular exam  Impression: 1. Multivessel native coronary artery disease with critical first obtuse marginal lesion. The large RCA has 50% lesion in the proximal section of the artery 2. Normal LV function   Plan:   1. PCI with stenting of the obtuse marginal lesion 2. Risk stratification     Neuro/Psych negative neurological ROS  negative psych ROS   GI/Hepatic negative GI ROS, Neg liver ROS,   Endo/Other  diabetes  Renal/GU negative Renal ROS     Musculoskeletal negative musculoskeletal ROS (+)   Abdominal   Peds  Hematology negative hematology ROS (+)   Anesthesia Other Findings Day of surgery medications reviewed with the patient.  Reproductive/Obstetrics                            Anesthesia Physical Anesthesia Plan  ASA: III  Anesthesia Plan: General   Post-op Pain Management:    Induction: Intravenous, Rapid sequence and Cricoid pressure planned  PONV Risk Score and Plan: 4 or greater and Ondansetron, Dexamethasone, Diphenhydramine and Treatment may vary due to age or medical condition  Airway Management Planned: Oral ETT  Additional Equipment:   Intra-op Plan:   Post-operative Plan: Extubation in OR  Informed Consent: I have reviewed the patients History and Physical, chart, labs and discussed the procedure including  the risks, benefits and alternatives for the proposed anesthesia with the patient or authorized representative who has indicated his/her understanding and acceptance.   Dental advisory given  Plan Discussed with: CRNA, Anesthesiologist and Surgeon  Anesthesia Plan Comments:        Anesthesia Quick Evaluation

## 2018-01-11 NOTE — Progress Notes (Signed)
   Subjective: [name] is doing well this morning.  He denies abdominal pain, nausea, vomiting.  He will undergo a partial colectomy tomorrow.  He does not have any further questions at this time.  Objective:  Vital signs in last 24 hours: Vitals:   01/10/18 1027 01/10/18 1505 01/10/18 2159 01/11/18 0525  BP: (!) 160/94 (!) 155/86 (!) 158/91 125/81  Pulse: 69 73 69 60  Resp:  18    Temp:  98.1 F (36.7 C)  97.7 F (36.5 C)  TempSrc:  Oral    SpO2:  97% 98% 95%  Weight:      Height:       Physical Exam Vitals signs and nursing note reviewed.  Constitutional:      Appearance: He is well-developed and well-nourished.  Abdominal:     General: There is no distension.     Palpations: Abdomen is soft.     Tenderness: There is no abdominal tenderness.  Neurological:     Mental Status: He is alert.  Psychiatric:        Mood and Affect: Mood and affect normal.        Behavior: Behavior normal.     Assessment/Plan:  Principal Problem:   Small bowel obstruction (HCC) Active Problems:   CAD (coronary artery disease) of artery bypass graft  [name] admitted for small bowel obstruction which is now resolved.  He will undergo a partial colectomy tomorrow.  He will get a bowel prep today and be made n.p.o. tonight at midnight.  Small bowel obstruction: - On clearliquid diet.  N.p.o. tonight at midnight. - Zofran 4 mg every 6 hours PRN nausea - Pantoprazole 40 mg daily  CADs/piliac stent placement in 06/2017 on Plavix: - Continue holding Plavix in preparation for surgery tomorrow.  Hypertension:His blood pressure has remained within normal limits while on home carvedilol, amlodipine, and lisinopril. - Continue carvedilol 3.12 mg twice daily - Continue amlodipine 10 mg daily - Continue lisinopril 20 mg daily  History of cervical nerve impingement: -Continue IV Toradol 30 mgevery 6 hours PRN.  DVT SWN:IOEVOJJ 40 mg Subcu CODE STATUS: FULL FEN GI: Clear  liquid diet.  N.p.o. at midnight.  Dispo: Anticipated dischargewithin 2 to 3 days after surgery tomorrow.  Lance Sage, MD 01/11/2018, 6:48 AM Pager: [phone]

## 2018-01-11 NOTE — Progress Notes (Signed)
Internal Medicine Attending:   I saw and examined the patient. I reviewed the Dr Jerlyn Ly note and I agree with the resident's findings and plan as documented in the resident's note. Doing well, NPO at MN, Partial colectomy tomorrow.

## 2018-01-11 NOTE — Progress Notes (Signed)
Pt colon bowel prep orders that needed to start at 7am was not done as scheduled I have called the surgeon on call for doctor cornett thomas x2 waiting for response

## 2018-01-12 ENCOUNTER — Encounter (HOSPITAL_COMMUNITY)
Admission: EM | Disposition: A | Discharge status: Home / Self Care | Payer: Self-pay | Source: Home / Self Care | Attending: Internal Medicine

## 2018-01-12 ENCOUNTER — Inpatient Hospital Stay (HOSPITAL_COMMUNITY): Payer: Medicare HMO | Admitting: Anesthesiology

## 2018-01-12 DIAGNOSIS — Z9049 Acquired absence of other specified parts of digestive tract: Secondary | ICD-10-CM

## 2018-01-12 DIAGNOSIS — G589 Mononeuropathy, unspecified: Secondary | ICD-10-CM

## 2018-01-12 DIAGNOSIS — I25810 Atherosclerosis of coronary artery bypass graft(s) without angina pectoris: Secondary | ICD-10-CM

## 2018-01-12 DIAGNOSIS — Z960 Presence of urogenital implants: Secondary | ICD-10-CM

## 2018-01-12 HISTORY — PX: LAPAROSCOPIC PARTIAL COLECTOMY: SHX5907

## 2018-01-12 LAB — GLUCOSE, CAPILLARY
GLUCOSE-CAPILLARY: 151 mg/dL — AB (ref 70–99)
Glucose-Capillary: 88 mg/dL (ref 70–99)
Glucose-Capillary: 128 mg/dL — ABNORMAL HIGH (ref 70–99)
Glucose-Capillary: 149 mg/dL — ABNORMAL HIGH (ref 70–99)
Glucose-Capillary: 157 mg/dL — ABNORMAL HIGH (ref 70–99)

## 2018-01-12 SURGERY — LAPAROSCOPIC PARTIAL COLECTOMY
Anesthesia: General | Site: Abdomen

## 2018-01-12 MED ORDER — LACTATED RINGERS IV SOLN
INTRAVENOUS | Status: DC | PRN
  Administered 2018-01-12 (×2): via INTRAVENOUS

## 2018-01-12 MED ORDER — CEFOTETAN DISODIUM-DEXTROSE 2-2.08 GM-%(50ML) IV SOLR
INTRAVENOUS | Status: AC
  Filled 2018-01-12: qty 50

## 2018-01-12 MED ORDER — MIDAZOLAM HCL 2 MG/2ML IJ SOLN
INTRAMUSCULAR | Status: AC
  Filled 2018-01-12: qty 2

## 2018-01-12 MED ORDER — PROMETHAZINE HCL 25 MG/ML IJ SOLN
6.2500 mg | INTRAMUSCULAR | Status: DC | PRN
  Administered 2018-01-12: 6.25 mg via INTRAVENOUS

## 2018-01-12 MED ORDER — ONDANSETRON HCL 4 MG/2ML IJ SOLN
INTRAMUSCULAR | Status: DC | PRN
  Administered 2018-01-12: 4 mg via INTRAVENOUS

## 2018-01-12 MED ORDER — SODIUM CHLORIDE 0.9 % IR SOLN
Status: DC | PRN
  Administered 2018-01-12: 1000 mL

## 2018-01-12 MED ORDER — DEXAMETHASONE SODIUM PHOSPHATE 4 MG/ML IJ SOLN
INTRAMUSCULAR | Status: DC | PRN
  Administered 2018-01-12: 5 mg via INTRAVENOUS

## 2018-01-12 MED ORDER — METRONIDAZOLE 500 MG PO TABS
1000.0000 mg | ORAL_TABLET | Freq: Four times a day (QID) | ORAL | Status: DC
  Administered 2018-01-12: 1000 mg via ORAL
  Filled 2018-01-12 (×2): qty 2

## 2018-01-12 MED ORDER — SUGAMMADEX SODIUM 200 MG/2ML IV SOLN
INTRAVENOUS | Status: DC | PRN
  Administered 2018-01-12: 170 mg via INTRAVENOUS

## 2018-01-12 MED ORDER — SODIUM CHLORIDE 0.9 % IV SOLN
INTRAVENOUS | Status: DC | PRN
  Administered 2018-01-12: 10 ug/min via INTRAVENOUS

## 2018-01-12 MED ORDER — PROPOFOL 10 MG/ML IV BOLUS
INTRAVENOUS | Status: DC | PRN
  Administered 2018-01-12: 150 mg via INTRAVENOUS
  Administered 2018-01-12: 20 mg via INTRAVENOUS

## 2018-01-12 MED ORDER — PROPOFOL 10 MG/ML IV BOLUS
INTRAVENOUS | Status: AC
  Filled 2018-01-12: qty 20

## 2018-01-12 MED ORDER — BUPIVACAINE-EPINEPHRINE (PF) 0.25% -1:200000 IJ SOLN
INTRAMUSCULAR | Status: AC
  Filled 2018-01-12: qty 30

## 2018-01-12 MED ORDER — HYDROMORPHONE HCL 1 MG/ML IJ SOLN
0.2500 mg | INTRAMUSCULAR | Status: DC | PRN
  Administered 2018-01-12: 0.5 mg via INTRAVENOUS
  Administered 2018-01-12: 0.25 mg via INTRAVENOUS
  Administered 2018-01-12: 0.5 mg via INTRAVENOUS

## 2018-01-12 MED ORDER — SODIUM CHLORIDE 0.9 % IV SOLN
2.0000 g | INTRAVENOUS | Status: AC
  Filled 2018-01-12 (×2): qty 2

## 2018-01-12 MED ORDER — FENTANYL CITRATE (PF) 100 MCG/2ML IJ SOLN
50.0000 ug | INTRAMUSCULAR | Status: DC | PRN
  Administered 2018-01-12 (×4): 50 ug via INTRAVENOUS
  Filled 2018-01-12 (×5): qty 2

## 2018-01-12 MED ORDER — PROMETHAZINE HCL 25 MG/ML IJ SOLN
INTRAMUSCULAR | Status: AC
  Filled 2018-01-12: qty 1

## 2018-01-12 MED ORDER — FENTANYL CITRATE (PF) 250 MCG/5ML IJ SOLN
INTRAMUSCULAR | Status: AC
  Filled 2018-01-12: qty 5

## 2018-01-12 MED ORDER — SODIUM CHLORIDE 0.9 % IV SOLN
INTRAVENOUS | Status: DC | PRN
  Administered 2018-01-12: 2 g via INTRAVENOUS

## 2018-01-12 MED ORDER — METRONIDAZOLE 500 MG PO TABS
1000.0000 mg | ORAL_TABLET | Freq: Once | ORAL | Status: AC
  Administered 2018-01-12: 1000 mg via ORAL

## 2018-01-12 MED ORDER — HYDROMORPHONE HCL 1 MG/ML IJ SOLN
INTRAMUSCULAR | Status: AC
  Filled 2018-01-12: qty 1

## 2018-01-12 MED ORDER — ROCURONIUM BROMIDE 100 MG/10ML IV SOLN
INTRAVENOUS | Status: DC | PRN
  Administered 2018-01-12: 10 mg via INTRAVENOUS
  Administered 2018-01-12: 50 mg via INTRAVENOUS
  Administered 2018-01-12 (×2): 10 mg via INTRAVENOUS

## 2018-01-12 MED ORDER — SUCCINYLCHOLINE CHLORIDE 20 MG/ML IJ SOLN
INTRAMUSCULAR | Status: DC | PRN
  Administered 2018-01-12: 100 mg via INTRAVENOUS

## 2018-01-12 MED ORDER — ALBUMIN HUMAN 5 % IV SOLN
INTRAVENOUS | Status: DC | PRN
  Administered 2018-01-12: 09:00:00 via INTRAVENOUS

## 2018-01-12 MED ORDER — LIDOCAINE HCL (CARDIAC) PF 100 MG/5ML IV SOSY
PREFILLED_SYRINGE | INTRAVENOUS | Status: DC | PRN
  Administered 2018-01-12: 60 mg via INTRAVENOUS
  Administered 2018-01-12: 40 mg via INTRAVENOUS

## 2018-01-12 MED ORDER — FENTANYL CITRATE (PF) 250 MCG/5ML IJ SOLN
INTRAMUSCULAR | Status: DC | PRN
  Administered 2018-01-12 (×4): 50 ug via INTRAVENOUS

## 2018-01-12 MED ORDER — 0.9 % SODIUM CHLORIDE (POUR BTL) OPTIME
TOPICAL | Status: DC | PRN
  Administered 2018-01-12 (×4): 1000 mL

## 2018-01-12 MED ORDER — PHENYLEPHRINE HCL 10 MG/ML IJ SOLN
INTRAMUSCULAR | Status: DC | PRN
  Administered 2018-01-12 (×3): 80 ug via INTRAVENOUS

## 2018-01-12 SURGICAL SUPPLY — 83 items
APPLIER CLIP 5 13 M/L LIGAMAX5 (MISCELLANEOUS)
BLADE CLIPPER SURG (BLADE) IMPLANT
CANISTER SUCT 3000ML PPV (MISCELLANEOUS) ×3 IMPLANT
CELLS DAT CNTRL 66122 CELL SVR (MISCELLANEOUS) IMPLANT
CHLORAPREP W/TINT 26ML (MISCELLANEOUS) ×3 IMPLANT
CLIP APPLIE 5 13 M/L LIGAMAX5 (MISCELLANEOUS) IMPLANT
COVER MAYO STAND STRL (DRAPES) ×3 IMPLANT
COVER SURGICAL LIGHT HANDLE (MISCELLANEOUS) ×6 IMPLANT
COVER WAND RF STERILE (DRAPES) ×1 IMPLANT
DRAPE HALF SHEET 40X57 (DRAPES) ×2 IMPLANT
DRAPE UTILITY XL STRL (DRAPES) ×3 IMPLANT
DRAPE WARM FLUID 44X44 (DRAPE) ×5 IMPLANT
DRSG OPSITE POSTOP 4X10 (GAUZE/BANDAGES/DRESSINGS) IMPLANT
DRSG OPSITE POSTOP 4X8 (GAUZE/BANDAGES/DRESSINGS) ×2 IMPLANT
DRSG TEGADERM 2-3/8X2-3/4 SM (GAUZE/BANDAGES/DRESSINGS) ×2 IMPLANT
ELECT BLADE 6.5 EXT (BLADE) IMPLANT
ELECT CAUTERY BLADE 6.4 (BLADE) ×6 IMPLANT
ELECT REM PT RETURN 9FT ADLT (ELECTROSURGICAL) ×3
ELECTRODE REM PT RTRN 9FT ADLT (ELECTROSURGICAL) ×1 IMPLANT
GAUZE SPONGE 2X2 8PLY STRL LF (GAUZE/BANDAGES/DRESSINGS) ×1 IMPLANT
GEL ULTRASOUND 20GR AQUASONIC (MISCELLANEOUS) IMPLANT
GLOVE BIO SURGEON STRL SZ8 (GLOVE) ×2 IMPLANT
GLOVE BIOGEL PI IND STRL 6.5 (GLOVE) IMPLANT
GLOVE BIOGEL PI IND STRL 7.5 (GLOVE) IMPLANT
GLOVE BIOGEL PI IND STRL 8 (GLOVE) ×2 IMPLANT
GLOVE BIOGEL PI INDICATOR 6.5 (GLOVE) ×4
GLOVE BIOGEL PI INDICATOR 7.5 (GLOVE) ×4
GLOVE BIOGEL PI INDICATOR 8 (GLOVE) ×4
GLOVE SURG SS PI 6.0 STRL IVOR (GLOVE) ×4 IMPLANT
GLOVE SURG SS PI 7.5 STRL IVOR (GLOVE) ×4 IMPLANT
GLOVE SURG SS PI 8.0 STRL IVOR (GLOVE) ×4 IMPLANT
GOWN STRL REUS W/ TWL LRG LVL3 (GOWN DISPOSABLE) ×6 IMPLANT
GOWN STRL REUS W/ TWL XL LVL3 (GOWN DISPOSABLE) ×2 IMPLANT
GOWN STRL REUS W/TWL LRG LVL3 (GOWN DISPOSABLE) ×8
GOWN STRL REUS W/TWL XL LVL3 (GOWN DISPOSABLE) ×4
KIT BASIN OR (CUSTOM PROCEDURE TRAY) ×5 IMPLANT
KIT TURNOVER KIT B (KITS) ×3 IMPLANT
LEGGING LITHOTOMY PAIR STRL (DRAPES) IMPLANT
LIGASURE IMPACT 36 18CM CVD LR (INSTRUMENTS) ×2 IMPLANT
NS IRRIG 1000ML POUR BTL (IV SOLUTION) ×6 IMPLANT
PAD ARMBOARD 7.5X6 YLW CONV (MISCELLANEOUS) ×6 IMPLANT
PENCIL BUTTON HOLSTER BLD 10FT (ELECTRODE) ×4 IMPLANT
RELOAD PROXIMATE 75MM BLUE (ENDOMECHANICALS) ×9 IMPLANT
RELOAD STAPLE 75 3.8 BLU REG (ENDOMECHANICALS) IMPLANT
RETRACTOR WND ALEXIS 18 MED (MISCELLANEOUS) IMPLANT
RTRCTR WOUND ALEXIS 18CM MED (MISCELLANEOUS)
SCISSORS LAP 5X35 DISP (ENDOMECHANICALS) ×3 IMPLANT
SET IRRIG TUBING LAPAROSCOPIC (IRRIGATION / IRRIGATOR) ×2 IMPLANT
SHEARS HARMONIC ACE PLUS 36CM (ENDOMECHANICALS) ×2 IMPLANT
SLEEVE ENDOPATH XCEL 5M (ENDOMECHANICALS) ×5 IMPLANT
SPECIMEN JAR LARGE (MISCELLANEOUS) ×3 IMPLANT
SPONGE GAUZE 2X2 STER 10/PKG (GAUZE/BANDAGES/DRESSINGS) ×2
SPONGE LAP 18X18 X RAY DECT (DISPOSABLE) ×2 IMPLANT
STAPLER GUN LINEAR PROX 60 (STAPLE) ×2 IMPLANT
STAPLER PROXIMATE 75MM BLUE (STAPLE) ×4 IMPLANT
STAPLER VISISTAT 35W (STAPLE) ×1 IMPLANT
SUCTION POOLE TIP (SUCTIONS) ×3 IMPLANT
SURGILUBE 2OZ TUBE FLIPTOP (MISCELLANEOUS) IMPLANT
SUT PDS AB 1 TP1 54 (SUTURE) ×4 IMPLANT
SUT PROLENE 2 0 CT2 30 (SUTURE) IMPLANT
SUT PROLENE 2 0 KS (SUTURE) IMPLANT
SUT VIC AB 2-0 SH 18 (SUTURE) ×3 IMPLANT
SUT VIC AB 3-0 SH 18 (SUTURE) ×3 IMPLANT
SUT VICRYL AB 2 0 TIES (SUTURE) ×3 IMPLANT
SUT VICRYL AB 3 0 TIES (SUTURE) ×1 IMPLANT
SYR BULB IRRIGATION 50ML (SYRINGE) ×4 IMPLANT
SYS LAPSCP GELPORT 120MM (MISCELLANEOUS) ×3
SYSTEM LAPSCP GELPORT 120MM (MISCELLANEOUS) IMPLANT
TOWEL OR 17X26 10 PK STRL BLUE (TOWEL DISPOSABLE) ×6 IMPLANT
TRAY FOL W/BAG SLVR 16FR STRL (SET/KITS/TRAYS/PACK) IMPLANT
TRAY FOLEY MTR SLVR 16FR STAT (SET/KITS/TRAYS/PACK) ×1 IMPLANT
TRAY FOLEY W/BAG SLVR 16FR LF (SET/KITS/TRAYS/PACK) ×2
TRAY LAPAROSCOPIC MC (CUSTOM PROCEDURE TRAY) ×3 IMPLANT
TRAY PROCTOSCOPIC FIBER OPTIC (SET/KITS/TRAYS/PACK) IMPLANT
TROCAR XCEL BLUNT TIP 100MML (ENDOMECHANICALS) IMPLANT
TROCAR XCEL NON-BLD 11X100MML (ENDOMECHANICALS) IMPLANT
TROCAR XCEL NON-BLD 5MMX100MML (ENDOMECHANICALS) ×3 IMPLANT
TUBE CONNECTING 12'X1/4 (SUCTIONS) ×2
TUBE CONNECTING 12X1/4 (SUCTIONS) ×4 IMPLANT
TUBING INSUF HEATED (TUBING) ×1 IMPLANT
TUBING INSUFFLATION (TUBING) ×2 IMPLANT
WATER STERILE IRR 1000ML POUR (IV SOLUTION) ×3 IMPLANT
YANKAUER SUCT BULB TIP NO VENT (SUCTIONS) ×6 IMPLANT

## 2018-01-12 NOTE — Progress Notes (Signed)
Pt is picked up by transport to OR  For partial laparoscopic colectomy

## 2018-01-12 NOTE — Anesthesia Procedure Notes (Signed)
Procedure Name: Intubation Date/Time: 01/12/2018 7:39 AM Performed by: Glynda Jaeger, CRNA Pre-anesthesia Checklist: Patient identified, Patient being monitored, Timeout performed, Emergency Drugs available and Suction available Patient Re-evaluated:Patient Re-evaluated prior to induction Oxygen Delivery Method: Circle System Utilized Preoxygenation: Pre-oxygenation with 100% oxygen Induction Type: IV induction Ventilation: Mask ventilation without difficulty Laryngoscope Size: Mac and 4 Grade View: Grade I Tube type: Oral Tube size: 7.5 mm Number of attempts: 1 Airway Equipment and Method: Stylet Placement Confirmation: ETT inserted through vocal cords under direct vision,  positive ETCO2 and breath sounds checked- equal and bilateral Secured at: 23 cm Tube secured with: Tape Dental Injury: Teeth and Oropharynx as per pre-operative assessment

## 2018-01-12 NOTE — Transfer of Care (Signed)
Immediate Anesthesia Transfer of Care Note  Patient: [name]  Procedure(s) Performed: LAPAROSCOPIC ASSISTED PARTIAL COLECTOMY (N/A Abdomen)  Patient Location: PACU  Anesthesia Type:General  Level of Consciousness: awake, patient cooperative and responds to stimulation  Airway & Oxygen Therapy: Patient Spontanous Breathing and Patient connected to face mask oxygen  Post-op Assessment: Report given to RN, Post -op Vital signs reviewed and stable and Patient moving all extremities X 4  Post vital signs: Reviewed and stable  Last Vitals:  Vitals Value Taken Time  BP 138/76 01/12/2018 10:37 AM  Temp    Pulse 63 01/12/2018 10:38 AM  Resp 17 01/12/2018 10:38 AM  SpO2 99 % 01/12/2018 10:38 AM  Vitals shown include unvalidated device data.  Last Pain:  Vitals:   01/11/18 2143  TempSrc: Oral  PainSc:       Patients Stated Pain Goal: 0 (38/32/91 9166)  Complications: No apparent anesthesia complications

## 2018-01-12 NOTE — Interval H&P Note (Signed)
History and Physical Interval Note:  01/12/2018 7:06 AM  [name]  has presented today for surgery, with the diagnosis of colon  The various methods of treatment have been discussed with the patient and family. After consideration of risks, benefits and other options for treatment, the patient has consented to  Procedure(s): LAPAROSCOPIC ASSISTED PARTIAL COLECTOMY (N/A) as a surgical intervention .  The patient's history has been reviewed, patient examined, no change in status, stable for surgery.  I have reviewed the patient's chart and labs.  Questions were answered to the patient's satisfaction.     [place]

## 2018-01-12 NOTE — Progress Notes (Signed)
   Subjective:   Patient's wife at bedside after surgery this morning. She states he has been sleepy since surgery but doing well. Mr. Shartzer easily arousable, states he has some nausea and abdominal pain.   Objective:  Vital signs in last 24 hours: Vitals:   01/12/18 1115 01/12/18 1130 01/12/18 1145 01/12/18 1206  BP: 128/77 130/75 115/66 116/69  Pulse: 65 70 70 71  Resp: 15 16 16 18   Temp:   97.8 F (36.6 C) 98.1 F (36.7 C)  TempSrc:    Oral  SpO2: 93% 94% 96% 97%  Weight:      Height:       Physical Exam:  Constitution: asleep on exam but easily arousable, NAD Cardio: RRR, no m/r/g Respiratory: CTAB, no w/r/r Abdominal: non-distended, midline incision, 3 left lateral lap incisions with minimal serosanguinous strike through  MSK: no pitting edema  GU: urine clear, foley in place Neuro: tired, a&o, following commands Skin: otherwise c/d/i    Assessment/Plan:  Principal Problem:   Small bowel obstruction (HCC) Active Problems:   CAD (coronary artery disease) of artery bypass graft  72yo male admitted with PMH of billroth I  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 Patient doing well after surgery albeit having some pain and nausea. Will restart Plavix 12/18.   - fentanyl 53mcg q1h prn - zofran 4mg  q4h prn  - cont. SCDs - ice chips   CAD s/p iliac stent placement 06/2017 - hold plavix until 12/18  HTN - cont. Home medications  Nerve impingement - toradol 30 mg q6h four days - complete course tomorrow    VTE: SCDs IVF: none Diet: ice chips Code: full   Dispo: Anticipated discharge in approximately 2-3 days.   Cerena Baine A, DO 01/12/2018, 1:00 PM Pager: [phone]

## 2018-01-12 NOTE — Progress Notes (Addendum)
  Date: 01/12/2018  Patient name: [name]  Medical record number: [id number]  Date of birth: 1945/02/02   I have seen and evaluated this patient and I have discussed the plan of care with the house staff. Please see their note for complete details. I concur with their findings with the following additions/corrections:   I saw [name] today after surgery.  He was tired but comfortable.  It seems the case went well with successful resection of the mass that had led to his SBO, followed by anastomosis.  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  Continuing to hold Plavix for the time being, planning to restart on 12/18.  Defer to surgery for advancement of his diet.  Lenice Pressman, M.D., Ph.D. 01/12/2018, 2:48 PM

## 2018-01-12 NOTE — Plan of Care (Signed)

## 2018-01-12 NOTE — Anesthesia Postprocedure Evaluation (Signed)
Anesthesia Post Note  Patient: [name]  Procedure(s) Performed: LAPAROSCOPIC ASSISTED PARTIAL COLECTOMY (N/A Abdomen)     Patient location during evaluation: PACU Anesthesia Type: General Level of consciousness: awake and alert and oriented Pain management: pain level controlled Vital Signs Assessment: post-procedure vital signs reviewed and stable Respiratory status: spontaneous breathing, nonlabored ventilation, respiratory function stable and patient connected to nasal cannula oxygen Cardiovascular status: blood pressure returned to baseline and stable Postop Assessment: no apparent nausea or vomiting Anesthetic complications: no    Last Vitals:  Vitals:   01/12/18 1045 01/12/18 1100  BP: 138/76 137/81  Pulse: 69 65  Resp: 15 16  Temp:    SpO2: 98% 98%    Last Pain:  Vitals:   01/12/18 1100  TempSrc:   PainSc: 0-No pain                 Sergei Delo A.

## 2018-01-12 NOTE — Op Note (Addendum)
Preoperative diagnosis: Partial small bowel obstruction  Postoperative diagnosis: Mass of distal ileum proximal to the ileocecal valve causing small small bowel obstruction frozen section showed low-grade carcinoid  Procedure: Laparoscopic-assisted ileocecectomy  Surgeon: Erroll Luna, MD  Anesthesia: General  EBL: 40 cc  Specimen: Terminal ileum and cecum with appendix to pathology.  Frozen section revealed a mass in the distal ileum consistent with low-grade carcinoid causing a distal small bowel obstruction.  Drains: None  IV fluids: Per anesthesia record  Indications for procedure: The patient is a 72 year old male seen by Dr. Annie Main gross in the office in the past.  He was scheduled for elective ileocecectomy for a distal small bowel mass/mass at the appendiceal orifice.  He was admitted to the hospital with recurrent small bowel obstruction.  This resolved with conservative management but he was on long-term Plavix and it was felt that reversal of this while in house and proceeding with surgery while he was in the hospital more prudent given his recurrent bowel obstruction symptoms.The procedure was discussed with the patient.  Laparoscopic ileocecectomy discussed with the patient as well as non operative treatments. The risks of operative management include bleeding,  Infection,  Leak of anastamosis,  Ostomy formation, open procedure,  Sepsis,  Abcess,  Hernia,  DVT,  Pulmonary complications,  Cardiovascular  complications,  Injury to ureter,  Bladder,kidney,and anesthesia risks,  And death. The patient understands.  Questions answered.   The success of the procedure is 50-85 % for treating the patients symptoms. They agree to proceed.   Description of procedure: The patient was met in the holding area.  The procedure was reviewed as well as risks and benefits of surgery and potential other treatment options.  He agreed to proceed.  He was taken back the operating.  He is placed supine  upon the operating room table.  After induction of general anesthesia, Foley catheter was placed under sterile conditions both arms were tucked and his abdomen was prepped and draped in sterile fashion.  Timeout was done.  He received 1 g of cefotetan and per protocol.  Timeout was done.  A 5 mm right upper quadrant Optiview port was placed under direct vision.  The port was passed with direct laparoscopic guidance into the abdominal cavity without sign of bowel injury.  There were significant dense intra-abdominal adhesions.  2 other 5 mm ports were placed under direct vision.  We spent about 45 minutes taking down omental adhesions from the anterior abdominal wall.  Once this was done we could visualize the right colon and cecum.  This was tethered quite densely into the right lower quadrant.  We then mobilized the terminal ileum and cecum using the harmonic scalpel and sharp dissection away from the white line of Toldt up to the hepatic flexure.  He had dense upper abdominal adhesions secondary to previous Billroth I reconstruction for peptic ulcer disease.  Once is able to mobilize the cecum I made a 7 cm midline incision and placed the USAA device.  I placed my hand and finished mobilizing the cecum and terminal ileum without difficulty.  I was able to extract the terminal ileum and cecum out of the port.  I can palpate a marble sized mass in the distal ileum approximately 3 cm from the ileocecal valve.  I divided the terminal ileum at this point using a suture to orient the bowel.  I then took a separate load of the stapler to divide the cecum.  Mesenteric vessels taken down  with the LigaSure.  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  This was his point of obstruction looking at the dilation of the small bowel proximal to this.  Noted dilation that was quite minimal.  We then created a side-to-side functional  end-to-end anastomosis with a GIA 75 stapler and a TA 60.  A stitch was placed at the anastomotic miotic crotch.  There is no tension.  Mesenteric defect closed with 2-0 Vicryl.  This laid nicely with no tension and the anastomosis was tested by squeezing contents across it with no signs of leakage of enteric contents or air.  This was placed back into the right lower quadrant.  Irrigation was used until clear.:  No signs of metastatic disease noted.  Protocol followed and all gowns and gloves as well as instruments were changed and fresh draping was used.  Counts were correct at this point the case.  We then closed the fascia with #1 PDS.  Port sites were removed and closed with staples as was the midline incision after irrigation.  All final counts were found to be correct.  Patient was awoke extubated and taken to recovery in satisfactory condition.

## 2018-01-13 ENCOUNTER — Encounter (HOSPITAL_COMMUNITY): Payer: Self-pay | Admitting: Surgery

## 2018-01-13 DIAGNOSIS — Z933 Colostomy status: Secondary | ICD-10-CM

## 2018-01-13 DIAGNOSIS — Z932 Ileostomy status: Secondary | ICD-10-CM

## 2018-01-13 DIAGNOSIS — Z8719 Personal history of other diseases of the digestive system: Secondary | ICD-10-CM

## 2018-01-13 LAB — BASIC METABOLIC PANEL
Anion gap: 15 (ref 5–15)
BUN: 21 mg/dL (ref 8–23)
CO2: 21 mmol/L — ABNORMAL LOW (ref 22–32)
Calcium: 8.4 mg/dL — ABNORMAL LOW (ref 8.9–10.3)
Chloride: 102 mmol/L (ref 98–111)
Creatinine, Ser: 1.51 mg/dL — ABNORMAL HIGH (ref 0.61–1.24)
GFR calc Af Amer: 53 mL/min — ABNORMAL LOW (ref >60)
GFR calc non Af Amer: 45 mL/min — ABNORMAL LOW (ref >60)
Glucose, Bld: 114 mg/dL — ABNORMAL HIGH (ref 70–99)
Potassium: 3.9 mmol/L (ref 3.5–5.1)
Sodium: 138 mmol/L (ref 135–145)

## 2018-01-13 LAB — CBC
HCT: 38.6 % — ABNORMAL LOW (ref 39.0–52.0)
Hemoglobin: 12.3 g/dL — ABNORMAL LOW (ref 13.0–17.0)
MCH: 28.3 pg (ref 26.0–34.0)
MCHC: 31.9 g/dL (ref 30.0–36.0)
MCV: 88.7 fL (ref 80.0–100.0)
Platelets: 171 10*3/uL (ref 150–400)
RBC: 4.35 MIL/uL (ref 4.22–5.81)
RDW: 12.8 % (ref 11.5–15.5)
WBC: 10 10*3/uL (ref 4.0–10.5)
nRBC: 0 % (ref 0.0–0.2)

## 2018-01-13 LAB — GLUCOSE, CAPILLARY
Glucose-Capillary: 84 mg/dL (ref 70–99)
Glucose-Capillary: 201 mg/dL — ABNORMAL HIGH (ref 70–99)
Glucose-Capillary: 89 mg/dL (ref 70–99)
Glucose-Capillary: 132 mg/dL — ABNORMAL HIGH (ref 70–99)

## 2018-01-13 MED ORDER — ENOXAPARIN SODIUM 40 MG/0.4ML ~~LOC~~ SOLN
40.0000 mg | SUBCUTANEOUS | Status: DC
  Administered 2018-01-13 – 2018-01-15 (×3): 40 mg via SUBCUTANEOUS
  Filled 2018-01-13 (×3): qty 0.4

## 2018-01-13 MED ORDER — ACETAMINOPHEN 325 MG PO TABS
650.0000 mg | ORAL_TABLET | Freq: Four times a day (QID) | ORAL | Status: DC
  Administered 2018-01-13 – 2018-01-17 (×15): 650 mg via ORAL
  Filled 2018-01-13 (×19): qty 2

## 2018-01-13 MED ORDER — OXYCODONE HCL 5 MG PO TABS
5.0000 mg | ORAL_TABLET | ORAL | Status: DC | PRN
  Administered 2018-01-13 – 2018-01-15 (×4): 5 mg via ORAL
  Filled 2018-01-13 (×4): qty 1

## 2018-01-13 MED ORDER — SODIUM CHLORIDE 0.9 % IV BOLUS
1000.0000 mL | Freq: Once | INTRAVENOUS | Status: AC
  Administered 2018-01-13: 1000 mL via INTRAVENOUS

## 2018-01-13 NOTE — Progress Notes (Signed)
[facility] Progress Note  1 Day Post-Op  Subjective: CC:  Complaining of lower abdominal pain described as pressure/cramping pain "he needs to have a bowel movement ".  Denies nausea or vomiting postoperatively.  Has been up to the chair but has not ambulated in the hallway.  Objective: Vital signs in last 24 hours: Temp:  [97.8 F (36.6 C)-98.7 F (37.1 C)] 98.2 F (36.8 C) (12/17 0527) Pulse Rate:  [64-75] 69 (12/17 0527) Resp:  [15-18] 16 (12/17 0527) BP: (103-138)/(66-81) 110/72 (12/17 0821) SpO2:  [93 %-98 %] 95 % (12/17 0527) Last BM Date: 01/11/18  Intake/Output from previous day: 12/16 0701 - 12/17 0700 In: 1350 [I.V.:1100; IV Piggyback:250] Out: 675 [Urine:670; Blood:5] Intake/Output this shift: Total I/O In: -  Out: 350 [Urine:350]  PE: Gen:  Alert, NAD, pleasant Card:  Regular rate and rhythm, pedal pulses 2+ BL Pulm:  Normal effort, clear to auscultation bilaterally Abd: Soft, appropriately tender, mild distention, midline incision clean and dry with honeycomb in place, 3 trocar sites over left abdomen with dressing in place, hypoactive bowel sounds Skin: warm and dry, no rashes  Psych: A&Ox3   Lab Results:  Recent Labs    01/13/18 0321  WBC 10.0  HGB 12.3*  HCT 38.6*  PLT 171   BMET Recent Labs    01/13/18 0321  NA 138  K 3.9  CL 102  CO2 21*  GLUCOSE 114*  BUN 21  CREATININE 1.51*  CALCIUM 8.4*   PT/INR No results for input(s): LABPROT, INR in the last 72 hours. CMP     Component Value Date/Time   NA 138 01/13/2018 0321   K 3.9 01/13/2018 0321   CL 102 01/13/2018 0321   CO2 21 (L) 01/13/2018 0321   GLUCOSE 114 (H) 01/13/2018 0321   BUN 21 01/13/2018 0321   CREATININE 1.51 (H) 01/13/2018 0321   CALCIUM 8.4 (L) 01/13/2018 0321   PROT 5.5 (L) 01/07/2018 0532   ALBUMIN 3.3 (L) 01/07/2018 0532   AST 20 01/07/2018 0532   ALT 21 01/07/2018 0532   ALKPHOS 45 01/07/2018 0532   BILITOT 0.7 01/07/2018 0532   GFRNONAA 45  (L) 01/13/2018 0321   GFRAA 53 (L) 01/13/2018 0321   Lipase     Component Value Date/Time   LIPASE 64 (H) 01/06/2018 1245       Studies/Results: No results found.  Anti-infectives: Anti-infectives (From admission, onward)   Start     Dose/Rate Route Frequency Ordered Stop   01/13/18 0600  cefoTEtan (CEFOTAN) 2 g in sodium chloride 0.9 % 100 mL IVPB     2 g 200 mL/hr over 30 Minutes Intravenous On call to O.R. 01/12/18 1205 01/14/18 0559   01/12/18 2359  metroNIDAZOLE (FLAGYL) tablet 1,000 mg  Status:  Discontinued     1,000 mg Oral Every 6 hours 01/11/18 2147 01/12/18 0530   01/12/18 0653  cefoTEtan in Dextrose 5% (CEFOTAN) 2-2.08 GM-%(50ML) IVPB    Note to Pharmacy:  Holtzman, Ariel   : cabinet override      01/12/18 0653 01/12/18 1859   01/12/18 0530  metroNIDAZOLE (FLAGYL) tablet 1,000 mg     1,000 mg Oral  Once 01/12/18 0528 01/12/18 0545   01/12/18 0015  metroNIDAZOLE (FLAGYL) tablet 1,000 mg  Status:  Discontinued     1,000 mg Oral Every 6 hours 01/12/18 0009 01/12/18 0530   01/12/18 0000  neomycin (MYCIFRADIN) tablet 1,000 mg     1,000 mg Oral Every 6 hours 01/11/18 2139  01/11/18 0600  cefoTEtan (CEFOTAN) 2 g in sodium chloride 0.9 % 100 mL IVPB  Status:  Discontinued     2 g 200 mL/hr over 30 Minutes Intravenous On call to O.R. 01/10/18 1828 01/10/18 1912   01/11/18 0600  cefoTEtan (CEFOTAN) 2 g in sodium chloride 0.9 % 100 mL IVPB  Status:  Discontinued     2 g 200 mL/hr over 30 Minutes Intravenous On call to O.R. 01/10/18 1912 01/10/18 1914   01/11/18 0600  cefoTEtan (CEFOTAN) 2 g in sodium chloride 0.9 % 100 mL IVPB     2 g 200 mL/hr over 30 Minutes Intravenous On call to O.R. 01/10/18 1915 01/12/18 0559       Assessment/Plan  DM HTN CAD s/p cardiac stent placement 06/2017 on plavix (last dose12/10) - cardiologist is Dr. Gerarda Gunther at [facility] in [place] H/o partial gastrectomy for ulcer disease in 1993 Polyp at/near appendicealorifice polyp  SBO 2/2  carcinoid mass S/P Laparoscopic-assisted ileocecectomy 01/12/18 Dr. Brantley Stage - afebrile, VSS - follow pathology - allow clear liquids and await further bowel function - P.o. pain control - mobilize/OOB  - incentive spirometry q 1h    LOS: 7 days    [name], [facility] Pager: [phone]

## 2018-01-13 NOTE — Progress Notes (Signed)
  Date: 01/13/2018  Patient name: [name]  Medical record number: [id number]  Date of birth: Jun 22, 1945   I have seen and evaluated this patient and I have discussed the plan of care with the house staff. Please see their note for complete details. I concur with their findings with the following additions/corrections:   Doing well postop day 1 after laparoscopic resection with anastomosis of terminal ileum and cecum for a mass causing SBO.  He reports some lower abdominal pain today.  On exam, his abdomen is mildly tender but soft, surgical sites look good.  Bowel sounds are absent.  Per surgical team, advance to clear liquid diet today, will try to mobilize to help return of bowel function.  Continuing to hold aspirin and Plavix, plan to restart tomorrow unless there is a change in his status.   Lenice Pressman, M.D., Ph.D. 01/13/2018, 2:49 PM

## 2018-01-13 NOTE — Evaluation (Signed)
Physical Therapy Evaluation and Discharge Patient Details Name: [name] MRN: [id number] DOB: 04/22/1945 Today's Date: 01/13/2018   History of Present Illness  Pt is a 72 y.o. male admitted for small bowel obstruction and laproscopic assisted ileocecetomy on 01/12/18, tumor removed potentially carcinoid. PMH HTN HLD DM CAD.  Clinical Impression  Patient evaluated by physical therapy with no further acute PT needs identified. Pt amb 150 feet with supervision for safety, no gait deviations. PTA pt was independent. BP in sitting 110/94, BP s/p session 135/85. Pt educated on importance of frequent mobility and hallway ambulation during hospital stay and PT believes he is safe to amb with family or staff. All education has been completed and the patient has no further questions. No follow-up PT or equipment needs. PT is signing off. Thank you for this referral.     Follow Up Recommendations No PT follow up    Equipment Recommendations  None recommended by PT    Recommendations for Other Services       Precautions / Restrictions Precautions Precautions: None Restrictions Weight Bearing Restrictions: No      Mobility  Bed Mobility Overal bed mobility: Independent                Transfers Overall transfer level: Independent                  Ambulation/Gait Ambulation/Gait assistance: Supervision Gait Distance (Feet): 150 Feet   Gait Pattern/deviations: WFL(Within Functional Limits);Step-through pattern Gait velocity: decreased from baseline per patient Gait velocity interpretation: >2.62 ft/sec, indicative of community ambulatory General Gait Details: Supervision for safety, no deviations. At one point patient stopped and grabbed abdomen, stating laughing increased the pain. Pt reports he thinks the pain meds are making him dizzy, dizziness did not change with standing or amb. Pt states he feels safe to amb without PT assist. S/p amb BP 135/85   Stairs            Wheelchair Mobility    Modified Rankin (Stroke Patients Only)       Balance Overall balance assessment: No apparent balance deficits (not formally assessed)                                           Pertinent Vitals/Pain Pain Assessment: Faces Faces Pain Scale: Hurts even more Pain Location: abdomen, only with laughing Pain Intervention(s): Monitored during session    Home Living Family/patient expects to be discharged to:: Private residence Living Arrangements: Spouse/significant other Available Help at Discharge: Family Type of Home: House Home Access: Stairs to enter Entrance Stairs-Rails: Right Entrance Stairs-Number of Steps: 3 Home Layout: One level Home Equipment: Shower seat      Prior Function Level of Independence: Independent         Comments: driving, retired     Journalist, newspaper        Extremity/Trunk Assessment   Upper Extremity Assessment Upper Extremity Assessment: Overall WFL for tasks assessed    Lower Extremity Assessment Lower Extremity Assessment: Overall WFL for tasks assessed       Communication   Communication: No difficulties  Cognition Arousal/Alertness: Awake/alert Behavior During Therapy: WFL for tasks assessed/performed Overall Cognitive Status: Within Functional Limits for tasks assessed  General Comments: A&Ox4      General Comments      Exercises     Assessment/Plan    PT Assessment Patent does not need any further PT services  PT Problem List         PT Treatment Interventions      PT Goals (Current goals can be found in the Care Plan section)  Acute Rehab PT Goals Patient Stated Goal: go home PT Goal Formulation: With patient Time For Goal Achievement: 01/27/18 Potential to Achieve Goals: Good    Frequency     Barriers to discharge        Co-evaluation               AM-PAC PT "6 Clicks" Mobility  Outcome Measure  Help needed turning from your back to your side while in a flat bed without using bedrails?: None Help needed moving from lying on your back to sitting on the side of a flat bed without using bedrails?: None Help needed moving to and from a bed to a chair (including a wheelchair)?: None Help needed standing up from a chair using your arms (e.g., wheelchair or bedside chair)?: None Help needed to walk in hospital room?: None Help needed climbing 3-5 steps with a railing? : None 6 Click Score: 24    End of Session Equipment Utilized During Treatment: Gait belt Activity Tolerance: Patient tolerated treatment well Patient left: in chair;with call bell/phone within reach Nurse Communication: Mobility status      Time: 1448-1856 PT Time Calculation (min) (ACUTE ONLY): 14 min   Charges:   PT Evaluation $PT Eval Low Complexity: 1 Low          Gilda Crease, SPT Acute Rehab Services [facility] 01/13/2018, 2:50 PM

## 2018-01-13 NOTE — Progress Notes (Signed)
   Subjective:   [name] reports that he is doing well post-op but still endorses abdominal pain. Has not passed gas. He is tolerating sips of water with no difficulty. In addition, he is ambulating slowly and moving from bed to recliner. He re-iterated that he was previously on aspirin and plavix prior to admission to the hospital.   Objective:  Vital signs in last 24 hours: Vitals:   01/12/18 1145 01/12/18 1206 01/12/18 2056 01/13/18 0527  BP: 115/66 116/69 112/69 103/72  Pulse: 70 71 75 69  Resp: 16 18 17 16   Temp: 97.8 F (36.6 C) 98.1 F (36.7 C) 98.7 F (37.1 C) 98.2 F (36.8 C)  TempSrc:  Oral Oral Oral  SpO2: 96% 97% 97% 95%  Weight:      Height:       Physical Exam: Constitution: NAD, sitting at bedside Cardio: RRR, no m/r/g Respiratory: CTAB, no wheezing or rales Abdominal: non-distended, midline incision, 3 left lateral lap incisions without strikethrough MSK: no pitting edema  Neuro: tired, a&o, following commands Skin: otherwise c/d/i    Assessment/Plan:  Principal Problem:   Small bowel obstruction (HCC) Active Problems:   CAD (coronary artery disease) of artery bypass graft  72yo male admitted with PMH of billroth I  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 Pain currently controlled. Craving food but has not yet passed gas. Moving from bed to chair.   - pathology pending - oxy po q4h prn pain - advance diet per surgery  - am CBC  - PT   AKI Baseline Cr. appears to be around 1.21. Cr today 1.51. Likely secondary to fluid loss and toradol.   - stop toradol - fluid bolus 1L NS - am BMP   CAD s/p iliac stent placement 06/2017 - restart plavix, asa tomorrow   Hypertension Cont home blood pressure medications. Considering   VTE: lovenox IVF: 1L bolus Diet: ice chips Code: full   Dispo: Anticipated discharge in approximately 2 days.    Molli Hazard A, DO 01/13/2018, 7:24 AM Pager: [phone]

## 2018-01-14 DIAGNOSIS — Z7982 Long term (current) use of aspirin: Secondary | ICD-10-CM

## 2018-01-14 DIAGNOSIS — C181 Malignant neoplasm of appendix: Secondary | ICD-10-CM

## 2018-01-14 LAB — BASIC METABOLIC PANEL
Anion gap: 11 (ref 5–15)
BUN: 17 mg/dL (ref 8–23)
CO2: 25 mmol/L (ref 22–32)
Calcium: 8.7 mg/dL — ABNORMAL LOW (ref 8.9–10.3)
Chloride: 103 mmol/L (ref 98–111)
Creatinine, Ser: 1.44 mg/dL — ABNORMAL HIGH (ref 0.61–1.24)
GFR calc Af Amer: 56 mL/min — ABNORMAL LOW (ref >60)
GFR calc non Af Amer: 48 mL/min — ABNORMAL LOW (ref >60)
Glucose, Bld: 108 mg/dL — ABNORMAL HIGH (ref 70–99)
Potassium: 3.9 mmol/L (ref 3.5–5.1)
Sodium: 139 mmol/L (ref 135–145)

## 2018-01-14 LAB — CBC
HCT: 37.6 % — ABNORMAL LOW (ref 39.0–52.0)
Hemoglobin: 12.7 g/dL — ABNORMAL LOW (ref 13.0–17.0)
MCH: 30.5 pg (ref 26.0–34.0)
MCHC: 33.8 g/dL (ref 30.0–36.0)
MCV: 90.2 fL (ref 80.0–100.0)
Platelets: 166 10*3/uL (ref 150–400)
RBC: 4.17 MIL/uL — ABNORMAL LOW (ref 4.22–5.81)
RDW: 13.1 % (ref 11.5–15.5)
WBC: 9.1 10*3/uL (ref 4.0–10.5)
nRBC: 0 % (ref 0.0–0.2)

## 2018-01-14 LAB — GLUCOSE, CAPILLARY
GLUCOSE-CAPILLARY: 92 mg/dL (ref 70–99)
Glucose-Capillary: 99 mg/dL (ref 70–99)
Glucose-Capillary: 147 mg/dL — ABNORMAL HIGH (ref 70–99)

## 2018-01-14 MED ORDER — POLYETHYLENE GLYCOL 3350 17 G PO PACK
17.0000 g | PACK | Freq: Every day | ORAL | Status: DC | PRN
  Administered 2018-01-14 – 2018-01-15 (×2): 17 g via ORAL
  Filled 2018-01-14 (×2): qty 1

## 2018-01-14 MED ORDER — DOCUSATE SODIUM 50 MG/5ML PO LIQD
100.0000 mg | Freq: Two times a day (BID) | ORAL | Status: DC
  Administered 2018-01-14 – 2018-01-16 (×4): 100 mg via ORAL
  Filled 2018-01-14 (×4): qty 10

## 2018-01-14 MED ORDER — ASPIRIN EC 81 MG PO TBEC
81.0000 mg | DELAYED_RELEASE_TABLET | Freq: Every day | ORAL | Status: DC
  Administered 2018-01-14 – 2018-01-16 (×3): 81 mg via ORAL
  Filled 2018-01-14 (×3): qty 1

## 2018-01-14 MED ORDER — CLOPIDOGREL BISULFATE 75 MG PO TABS
75.0000 mg | ORAL_TABLET | Freq: Every day | ORAL | Status: DC
  Administered 2018-01-14 – 2018-01-16 (×3): 75 mg via ORAL
  Filled 2018-01-14 (×3): qty 1

## 2018-01-14 NOTE — Progress Notes (Signed)
[facility] Progress Note  2 Days Post-Op  Subjective: CC:  C/o trouble sleeping at night - unsure why, feels it may partially be due to abdominal discomfort but mostly attributes this to "being in the hospital". Tolerating clears without N/V. Feels he needs to have a BM but has not had a BM or flatus. Mobilizing. Pain controlled on oxycodone.  Objective: Vital signs in last 24 hours: Temp:  [98.3 F (36.8 C)-98.4 F (36.9 C)] 98.3 F (36.8 C) (12/17 2205) Pulse Rate:  [68-84] 84 (12/17 2205) Resp:  [16-18] 18 (12/17 2205) BP: (104-130)/(55-94) 130/55 (12/18 0843) SpO2:  [95 %-96 %] 95 % (12/17 2205) Last BM Date: 01/11/18  Intake/Output from previous day: 12/17 0701 - 12/18 0700 In: 1000 [IV Piggyback:1000] Out: 350 [Urine:350] Intake/Output this shift: No intake/output data recorded.  PE: Gen:  Alert, NAD, pleasant Abd: Soft, midline incision clean and dry, +BS, approp tender Skin: warm and dry, no rashes  Psych: A&Ox3   Lab Results:  Recent Labs    01/13/18 0321 01/14/18 0340  WBC 10.0 9.1  HGB 12.3* 12.7*  HCT 38.6* 37.6*  PLT 171 166   BMET Recent Labs    01/13/18 0321 01/14/18 0340  NA 138 139  K 3.9 3.9  CL 102 103  CO2 21* 25  GLUCOSE 114* 108*  BUN 21 17  CREATININE 1.51* 1.44*  CALCIUM 8.4* 8.7*   PT/INR No results for input(s): LABPROT, INR in the last 72 hours. CMP     Component Value Date/Time   NA 139 01/14/2018 0340   K 3.9 01/14/2018 0340   CL 103 01/14/2018 0340   CO2 25 01/14/2018 0340   GLUCOSE 108 (H) 01/14/2018 0340   BUN 17 01/14/2018 0340   CREATININE 1.44 (H) 01/14/2018 0340   CALCIUM 8.7 (L) 01/14/2018 0340   PROT 5.5 (L) 01/07/2018 0532   ALBUMIN 3.3 (L) 01/07/2018 0532   AST 20 01/07/2018 0532   ALT 21 01/07/2018 0532   ALKPHOS 45 01/07/2018 0532   BILITOT 0.7 01/07/2018 0532   GFRNONAA 48 (L) 01/14/2018 0340   GFRAA 56 (L) 01/14/2018 0340   Lipase     Component Value Date/Time   LIPASE 64 (H)  01/06/2018 1245       Studies/Results: No results found.  Anti-infectives:  Assessment/Plan DM HTN CAD s/p cardiac stent placement 06/2017 on plavix (last dose12/10) - cardiologist is Dr. Gerarda Gunther at [facility] in [place] H/o partial gastrectomy for ulcer disease in 1993 Polyp at/near appendicealorifice polyp  SBO 2/2 carcinoid mass S/P Laparoscopic-assisted ileocecectomy 01/12/18 Dr. Brantley Stage - afebrile, VSS - follow pathology - start stool softeners, PRN miralax  - continue clears until bowel function returns - P.o. pain control - mobilize/OOB at least 4-5 times daily - incentive spirometry q 1h    LOS: 8 days    Obie Dredge, [facility] Pager: [phone]

## 2018-01-14 NOTE — Progress Notes (Signed)
   Subjective:  Patient continues to have abdominal pain although does not want to change or increase pain medications. He feels as if he is going to pass gas or have a bm but has not yet. He has been ambulating and urinating. He has an appetite and has been drinking clear liquids. He denies nausea.   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 - TUMOR INVOLVES MUSCULARIS PROPRIA. - LYMPHOVASCULAR INVASION IS IDENTIFIED. - BENIGN APPENDIX. - THERE IS NO EVIDENCE OF TUMOR IN 1 OF 1 LYMPH NODE (0/1). - THE SURGICAL RESECTION MARGINS ARE NEGATIVE FOR TUMOR. - SEE ONCOLOGY TABLE BELOW.  Assessment/Plan:  Principal Problem:   Small bowel obstruction (HCC) Active Problems:   CAD (coronary artery disease) of artery bypass graft  72yo male admitted with PMH of billroth I SBO 2/2 appendiceal obstruction s/p TI &cecum resection 12/16 with carcinoid tumor on pathology.  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 He is doing well post-operatively, ambulating, and has +BS. Miralax/colace added per surgery to encourage bm.    - awaiting ROBF - oxy po q4h prn pain  - advance diet per surgery -currently CLD - am CBC - cont. Miralax, colace  AKI on CKD Bolused 1L NS  yesterday with mild improvement in creatinine function. Toradol held yesterday. He may be near his baseline as he is drinking fluids and states he is urinating a lot. Will continue to monitor   - am BMP  CAD s/p iliac stent placement 06/2017 - restart plavix, asa 81  Hypertension Cont. Home medications.  VTE: lovenox IVF: none Diet: CLD Code: full  Dispo: Anticipated discharge in approximately 1-2 days.   Molli Hazard A, DO 01/14/2018, 8:58 AM Pager: [phone]

## 2018-01-14 NOTE — Progress Notes (Signed)
  Date: 01/14/2018  Patient name: [name]  Medical record number: [id number]  Date of birth: [birth year]   I have seen and evaluated this patient and I have discussed the plan of care with the house staff. Please see their note for complete details. I concur with their findings with the following additions/corrections:   Somewhat improved from yesterday, but still having abdominal pain and reports no passing of flatus or stool yet.  Not much appetite yet either.  However, he does feel some activity in his abdomen.  Bowel sounds present on exam today, which is an improvement from yesterday.  Hopefully bowels will wake up a little bit today.  Pathology today confirmed carcinoid tumor, T2 staging by pathology, single lymph node that was present was negative.  Pathology also showed approximately 1% mitotic index, suggestive of well differentiated tumor with low mitotic activity.  CT abdomen on admission was negative for hepatic metastases or lymphadenopathy.  Overall, this is a reassuring picture for a low-grade, well-differentiated, localized carcinoid tumor that should have a good prognosis.  We will plan for oncology referral at discharge.  Lenice Pressman, M.D., Ph.D. 01/14/2018, 5:01 PM

## 2018-01-15 DIAGNOSIS — D3A012 Benign carcinoid tumor of the ileum: Secondary | ICD-10-CM | POA: Diagnosis present

## 2018-01-15 LAB — BASIC METABOLIC PANEL
Anion gap: 8 (ref 5–15)
BUN: 13 mg/dL (ref 8–23)
CO2: 28 mmol/L (ref 22–32)
Calcium: 8.7 mg/dL — ABNORMAL LOW (ref 8.9–10.3)
Chloride: 104 mmol/L (ref 98–111)
Creatinine, Ser: 1.25 mg/dL — ABNORMAL HIGH (ref 0.61–1.24)
GFR calc Af Amer: >60 mL/min (ref >60)
GFR calc non Af Amer: 57 mL/min — ABNORMAL LOW (ref >60)
Glucose, Bld: 117 mg/dL — ABNORMAL HIGH (ref 70–99)
Potassium: 3.5 mmol/L (ref 3.5–5.1)
Sodium: 140 mmol/L (ref 135–145)

## 2018-01-15 LAB — CBC
HCT: 36.7 % — ABNORMAL LOW (ref 39.0–52.0)
Hemoglobin: 12.1 g/dL — ABNORMAL LOW (ref 13.0–17.0)
MCH: 29.2 pg (ref 26.0–34.0)
MCHC: 33 g/dL (ref 30.0–36.0)
MCV: 88.6 fL (ref 80.0–100.0)
Platelets: 176 10*3/uL (ref 150–400)
RBC: 4.14 MIL/uL — ABNORMAL LOW (ref 4.22–5.81)
RDW: 12.9 % (ref 11.5–15.5)
WBC: 7.4 10*3/uL (ref 4.0–10.5)
nRBC: 0 % (ref 0.0–0.2)

## 2018-01-15 LAB — GLUCOSE, CAPILLARY
Glucose-Capillary: 112 mg/dL — ABNORMAL HIGH (ref 70–99)
Glucose-Capillary: 124 mg/dL — ABNORMAL HIGH (ref 70–99)
Glucose-Capillary: 110 mg/dL — ABNORMAL HIGH (ref 70–99)

## 2018-01-15 MED ORDER — ENSURE ENLIVE PO LIQD
237.0000 mL | Freq: Two times a day (BID) | ORAL | Status: DC
  Administered 2018-01-15 – 2018-01-19 (×7): 237 mL via ORAL

## 2018-01-15 MED ORDER — TRAMADOL HCL 50 MG PO TABS
50.0000 mg | ORAL_TABLET | ORAL | Status: DC | PRN
  Administered 2018-01-18 (×2): 50 mg via ORAL
  Filled 2018-01-15 (×2): qty 1

## 2018-01-15 NOTE — Progress Notes (Signed)
Initial Nutrition Assessment  DOCUMENTATION CODES:   Not applicable  INTERVENTION:   - Ensure Enlive po BID, each supplement provides 350 kcal and 20 grams of protein (strawberry flavor)  - Obtain new weight  NUTRITION DIAGNOSIS:   Inadequate oral intake related to altered GI function, early satiety as evidenced by meal completion < 25%, per patient/family report.  GOAL:   Patient will meet greater than or equal to 90% of their needs  MONITOR:   PO intake, Supplement acceptance, Diet advancement, I & O's, Weight trends, Labs  REASON FOR ASSESSMENT:   LOS    ASSESSMENT:   72 year old male who presented to the ED on 12/10 with severe abdominal pain and N/V. PMH significant for DM, HTN, s/p partial gastrectomy, HLD, CAD s/p stent placed 06/2017. Recent colonoscopy showed appendiceal orifice polyp determined to be tubular adenoma. Pt admitted with SBO.  12/12 - diet advanced to clear 12/16 - NPO for surgery, s/p laparoscopic assisted ileocecectomy 12/17 - diet advanced to clears 12/19 - advanced to full liquids  Noted pathology confirmed carcinoid tumor, T2 staging. Plan is for oncology referral at discharge.  Spoke with pt at bedside. Pt states that he had a BM this morning but is still having abdominal distention and uncomfortable bloating. Pt states that he was told this would improve when he passes more gas.  Pt shares that he is glad to have diet advanced to full liquids. Pt reports ordering yogurt, grits, vanilla pudding, and vanilla ice cream for lunch. Pt requests RD add milk to lunch tray. Pt shares that he is "getting full easily." Discussed importance of consuming most nutrient-dense foods and liquids from meal trays first. Pt expressed understanding.  No weight history PTA. Pt reports that his weight has been stable and that his UBW is 193 lbs. Weight on 12/11 was 187.3 lbs. Will order new weight to better monitor weight trends during admission.  Pt shares good  appetite up until admission. Pt reports he did not know he had a SBO and that it did not affect his eating PTA. Pt reports typically eating 3 meals daily. Pt also shares that he is very active at home and had no nutrition-related issues PTA.  Medications reviewed and include: Colace, SSI, Protonix  Labs reviewed: creatinine 1.25 (H) CBG's: 124, 112, 92 x 24 hours  NUTRITION - FOCUSED PHYSICAL EXAM:    Most Recent Value  Orbital Region  No depletion  Upper Arm Region  Mild depletion  Thoracic and Lumbar Region  No depletion  Buccal Region  No depletion  Temple Region  No depletion  Clavicle Bone Region  No depletion  Clavicle and Acromion Bone Region  Mild depletion  Scapular Bone Region  Unable to assess  Dorsal Hand  No depletion  Patellar Region  Mild depletion  Anterior Thigh Region  Mild depletion  Posterior Calf Region  Mild depletion  Edema (RD Assessment)  None  Hair  Reviewed  Eyes  Reviewed  Mouth  Reviewed  Skin  Reviewed  Nails  Reviewed       Diet Order:   Diet Order            Diet full liquid Room service appropriate? Yes; Fluid consistency: Thin  Diet effective now              EDUCATION NEEDS:   Education needs have been addressed  Skin:  Skin Assessment: Skin Integrity Issues: Incisions: abdomen  Last BM:  12/15 per chart documentation, 12/19 per notes  Height:   Ht Readings from Last 1 Encounters:  01/07/18 5\' 10"  (1.778 m)    Weight:   Wt Readings from Last 1 Encounters:  01/07/18 85 kg    Ideal Body Weight:  75.5 kg  BMI:  Body mass index is 26.87 kg/m.  Estimated Nutritional Needs:   Kcal:  2200-2400  Protein:  110-125 grams  Fluid:  >/= 2.0 L    Gaynell Face, MS, RD, LDN Inpatient Clinical Dietitian Pager: [phone] Weekend/After Hours: [phone]

## 2018-01-15 NOTE — Progress Notes (Signed)
   Subjective:  [name] is feeling well today. He states he had a BM but does still feel bloated. Denies nausea. Not feeling too hungry but minimal abdominal pain. He is doing well ambulating. Denies SOB.  Objective:  Vital signs in last 24 hours: Vitals:   01/14/18 0843 01/14/18 1548 01/14/18 2050 01/15/18 0509  BP: (!) 130/55 129/76 116/76 133/81  Pulse:  86 88 78  Resp:  17 16 16   Temp:  98.3 F (36.8 C) 97.8 F (36.6 C) 98.1 F (36.7 C)  TempSrc:  Oral Oral Oral  SpO2:  97% 95% 97%  Weight:      Height:       Physical Exam  Constitution: NAD, sitting in chair Cardio: RRR, no murmurs or gallops Respiratory: CTA, no wheezing, rales, rhonchi Abdominal: soft, non-distended, NTTP, +bs, bandages in place with no strikethrough MSK: moving all extremities, no edema  Assessment/Plan:  Principal Problem:   Small bowel obstruction (HCC) Active Problems:   CAD (coronary artery disease) of artery bypass graft  72yo male admitted with PMH of billroth I SBO 2/2 appendiceal obstruction s/p TI &cecum resection 12/16 with carcinoid tumor on pathology.  SBO 2/2 Appendiceal Mass s/p Ileocecectomy 12/16 BM today. On colace & miralax. Diet advanced to FLD but advised to go slow.   - oxy po q4h prn pain  - advance diet per surgery  - am CBC - cont. Miralax, colace  AKI on CKD resolved  CAD s/p iliac stent placement 06/2017 - cont. plavix, asa 81  Hypertension Cont. Home medications.  VTE: lovenox IVF: none Diet: FLD Code: full  Dispo: Anticipated discharge in approximately 1-2 days.   Molli Hazard A, DO 01/15/2018, 8:57 AM Pager: [phone]

## 2018-01-15 NOTE — Progress Notes (Signed)
[facility] Progress Note  3 Days Post-Op  Subjective: CC-  Up in chair this morning. Overall doing well. Had a BM this morning. No flatus. Tolerating clears without n/v, but states that he gets full quickly with PO intake. States that oxycodone makes him dizzy.  Objective: Vital signs in last 24 hours: Temp:  [97.8 F (36.6 C)-98.3 F (36.8 C)] 98.1 F (36.7 C) (12/19 0509) Pulse Rate:  [78-88] 78 (12/19 0509) Resp:  [16-17] 16 (12/19 0509) BP: (116-133)/(76-81) 133/81 (12/19 0509) SpO2:  [95 %-97 %] 97 % (12/19 0509) Last BM Date: 01/11/18  Intake/Output from previous day: 12/18 0701 - 12/19 0700 In: -  Out: 2000 [Urine:2000] Intake/Output this shift: No intake/output data recorded.  PE: Gen:  Alert, NAD, pleasant HEENT: EOM's intact, pupils equal and round Pulm:  effort normal Abd: Soft, mild distension, +BS, midline incision cdi with honeycomb dressing in place  Lab Results:  Recent Labs    01/14/18 0340 01/15/18 0423  WBC 9.1 7.4  HGB 12.7* 12.1*  HCT 37.6* 36.7*  PLT 166 176   BMET Recent Labs    01/14/18 0340 01/15/18 0423  NA 139 140  K 3.9 3.5  CL 103 104  CO2 25 28  GLUCOSE 108* 117*  BUN 17 13  CREATININE 1.44* 1.25*  CALCIUM 8.7* 8.7*   PT/INR No results for input(s): LABPROT, INR in the last 72 hours. CMP     Component Value Date/Time   NA 140 01/15/2018 0423   K 3.5 01/15/2018 0423   CL 104 01/15/2018 0423   CO2 28 01/15/2018 0423   GLUCOSE 117 (H) 01/15/2018 0423   BUN 13 01/15/2018 0423   CREATININE 1.25 (H) 01/15/2018 0423   CALCIUM 8.7 (L) 01/15/2018 0423   PROT 5.5 (L) 01/07/2018 0532   ALBUMIN 3.3 (L) 01/07/2018 0532   AST 20 01/07/2018 0532   ALT 21 01/07/2018 0532   ALKPHOS 45 01/07/2018 0532   BILITOT 0.7 01/07/2018 0532   GFRNONAA 57 (L) 01/15/2018 0423   GFRAA >60 01/15/2018 0423   Lipase     Component Value Date/Time   LIPASE 64 (H) 01/06/2018 1245       Studies/Results: No results  found.  Anti-infectives: Anti-infectives (From admission, onward)   Start     Dose/Rate Route Frequency Ordered Stop   01/13/18 0600  cefoTEtan (CEFOTAN) 2 g in sodium chloride 0.9 % 100 mL IVPB     2 g 200 mL/hr over 30 Minutes Intravenous On call to O.R. 01/12/18 1205 01/14/18 0559   01/12/18 2359  metroNIDAZOLE (FLAGYL) tablet 1,000 mg  Status:  Discontinued     1,000 mg Oral Every 6 hours 01/11/18 2147 01/12/18 0530   01/12/18 0653  cefoTEtan in Dextrose 5% (CEFOTAN) 2-2.08 GM-%(50ML) IVPB    Note to Pharmacy:  Holtzman, Ariel   : cabinet override      01/12/18 0653 01/12/18 1859   01/12/18 0530  metroNIDAZOLE (FLAGYL) tablet 1,000 mg     1,000 mg Oral  Once 01/12/18 0528 01/12/18 0545   01/12/18 0015  metroNIDAZOLE (FLAGYL) tablet 1,000 mg  Status:  Discontinued     1,000 mg Oral Every 6 hours 01/12/18 0009 01/12/18 0530   01/12/18 0000  neomycin (MYCIFRADIN) tablet 1,000 mg     1,000 mg Oral Every 6 hours 01/11/18 2139     01/11/18 0600  cefoTEtan (CEFOTAN) 2 g in sodium chloride 0.9 % 100 mL IVPB  Status:  Discontinued  2 g 200 mL/hr over 30 Minutes Intravenous On call to O.R. 01/10/18 1828 01/10/18 1912   01/11/18 0600  cefoTEtan (CEFOTAN) 2 g in sodium chloride 0.9 % 100 mL IVPB  Status:  Discontinued     2 g 200 mL/hr over 30 Minutes Intravenous On call to O.R. 01/10/18 1912 01/10/18 1914   01/11/18 0600  cefoTEtan (CEFOTAN) 2 g in sodium chloride 0.9 % 100 mL IVPB     2 g 200 mL/hr over 30 Minutes Intravenous On call to O.R. 01/10/18 1915 01/12/18 0559       Assessment/Plan DM HTN CAD s/p cardiac stent placement 06/2017 on plavix - cardiologist is Dr. Gerarda Gunther at [facility] in [place] H/o partial gastrectomy for ulcer disease in 1993 Polyp at/near appendicealorifice polyp  SBO2/2 carcinoid mass S/PLaparoscopic-assisted ileocecectomy12/16/19 Dr. Brantley Stage - POD 3 - path: carcinoid tumor, 0/1 LN involvement - afebrile, VSS - BM this AM  ID - none  currently FEN - IVF, FLD, colace Foley - none Follow up - Dr. Brantley Stage  Plan: Advance to full liquids but take it slow. Continue ambulating/OOB. Change oxycodone to tramadol.   LOS: 9 days    Wellington Hampshire , [facility] 01/15/2018, 11:06 AM Pager: [phone] Mon 7:00 am -11:30 AM Tues-Fri 7:00 am-4:30 pm Sat-Sun 7:00 am-11:30 am

## 2018-01-15 NOTE — Discharge Instructions (Signed)
[facility]      [facility] [phone]  OPEN ABDOMINAL SURGERY: POST OP INSTRUCTIONS  Always review your discharge instruction sheet given to you by the facility where your surgery was performed.  IF YOU HAVE DISABILITY OR FAMILY LEAVE FORMS, YOU MUST BRING THEM TO THE OFFICE FOR PROCESSING.  PLEASE DO NOT GIVE THEM TO YOUR DOCTOR.  1. A prescription for pain medication may be given to you upon discharge.  Take your pain medication as prescribed, if needed.  If narcotic pain medicine is not needed, then you may take acetaminophen (Tylenol) or ibuprofen (Advil) as needed. 2. Take your usually prescribed medications unless otherwise directed. 3. If you need a refill on your pain medication, please contact your pharmacy. They will contact our office to request authorization.  Prescriptions will not be filled after 5pm or on week-ends. 4. You should follow a light diet the first few days after arrival home, such as soup and crackers, pudding, etc.unless your doctor has advised otherwise. A high-fiber, low fat diet can be resumed as tolerated.   Be sure to include lots of fluids daily. Most patients will experience some swelling and bruising on the chest and neck area.  Ice packs will help.  Swelling and bruising can take several days to resolve 5. Most patients will experience some swelling and bruising in the area of the incision. Ice pack will help. Swelling and bruising can take several days to resolve..  6. It is common to experience some constipation if taking pain medication after surgery.  Increasing fluid intake and taking a stool softener will usually help or prevent this problem from occurring.  A mild laxative (Milk of Magnesia or Miralax) should be taken according to package directions if there are no bowel movements after 48 hours. 7.  You may have steri-strips (small skin tapes) in place directly over the incision.  These strips should be left on the skin for 7-10 days.  If your  surgeon used skin glue on the incision, you may shower in 24 hours.  The glue will flake off over the next 2-3 weeks.  Any sutures or staples will be removed at the office during your follow-up visit. You may find that a light gauze bandage over your incision may keep your staples from being rubbed or pulled. You may shower and replace the bandage daily. 8. ACTIVITIES:  You may resume regular (light) daily activities beginning the next day--such as daily self-care, walking, climbing stairs--gradually increasing activities as tolerated.  You may have sexual intercourse when it is comfortable.  Refrain from any heavy lifting or straining until approved by your doctor. a. You may drive when you no longer are taking prescription pain medication, you can comfortably wear a seatbelt, and you can safely maneuver your car and apply brakes b. Return to Work: ___________________________________ 9. You should see your doctor in the office for a follow-up appointment approximately two weeks after your surgery.  Make sure that you call for this appointment within a day or two after you arrive home to insure a convenient appointment time. OTHER INSTRUCTIONS:  _____________________________________________________________ _____________________________________________________________  WHEN TO CALL YOUR DOCTOR: 1. Fever over 101.0 2. Inability to urinate 3. Nausea and/or vomiting 4. Extreme swelling or bruising 5. Continued bleeding from incision. 6. Increased pain, redness, or drainage from the incision. 7. Difficulty swallowing or breathing 8. Muscle cramping or spasms. 9. Numbness or tingling in hands or feet or around lips.  The clinic staff is available to   answer your questions during regular business hours.  Please don't hesitate to call and ask to speak to one of the nurses if you have concerns.  For further questions, please visit [email or web address]   

## 2018-01-15 NOTE — Progress Notes (Addendum)
  Date: 01/15/2018  Patient name: [name]  Medical record number: [id number]  Date of birth: [birth year]   I have seen and evaluated this patient and I have discussed the plan of care with the house staff. Please see their note for complete details. I concur with their findings with the following additions/corrections:   Doing a little better today, was able to have a bowel movement earlier today.  However, this afternoon he feels very bloated and is not passing much gas.  On exam, his belly is distended and mildly tender, very tympanic.  Bowel sounds are more active than yesterday however.  Likely some degree of ileus, but progressing slowly.  Continue on clears per surgery recommendations.  I reviewed the pathology findings with him and his wife today.  I personally spent greater than 35 minutes in discussing the pathology and plan of care with [name] and his wife.  Lenice Pressman, M.D., Ph.D. 01/15/2018, 5:54 PM

## 2018-01-16 LAB — CBC
HCT: 38.9 % — ABNORMAL LOW (ref 39.0–52.0)
Hemoglobin: 13.1 g/dL (ref 13.0–17.0)
MCH: 30 pg (ref 26.0–34.0)
MCHC: 33.7 g/dL (ref 30.0–36.0)
MCV: 89 fL (ref 80.0–100.0)
Platelets: 278 10*3/uL (ref 150–400)
RBC: 4.37 MIL/uL (ref 4.22–5.81)
RDW: 13.1 % (ref 11.5–15.5)
WBC: 8.9 10*3/uL (ref 4.0–10.5)
nRBC: 0 % (ref 0.0–0.2)

## 2018-01-16 LAB — GLUCOSE, CAPILLARY
Glucose-Capillary: 116 mg/dL — ABNORMAL HIGH (ref 70–99)
Glucose-Capillary: 132 mg/dL — ABNORMAL HIGH (ref 70–99)
Glucose-Capillary: 125 mg/dL — ABNORMAL HIGH (ref 70–99)

## 2018-01-16 MED ORDER — DOCUSATE SODIUM 50 MG/5ML PO LIQD: 100.0000 mg | Freq: Two times a day (BID) | ORAL | Status: DC | PRN

## 2018-01-16 NOTE — Progress Notes (Signed)
  Date: 01/16/2018  Patient name: [name]  Medical record number: [id number]  Date of birth: August 22, 1945   I have seen and evaluated this patient and I have discussed the plan of care with the house staff. Please see their note for complete details. I concur with their findings with the following additions/corrections:   Bowel activity has been improving, having more BMs and passing gas today. Developed hematochezia this afternoon, 2 episodes so far. Most recent vitals ok, not tachycardic, but BP a little soft. Will check repeat H/H this afternoon and tomorrow. RN spoke with surgery, they suspect anastamotic bleeding, conservative management is preferable. Holding DVT prophylaxis and DAPT, hopefully will stop on its own, if not or if worsens, will need to discuss options with surgery of re-operation vs endoscopy vs IR embolization. Given recent coronary stent, hope to restart DAPT soon, can probably leave DVT prophylaxis off as he is ambulatory.   Lenice Pressman, M.D., Ph.D. 01/16/2018, 6:09 PM

## 2018-01-16 NOTE — Care Management Important Message (Signed)
Important Message  Patient Details  Name: SAMAAD HASHEM MRN: [id number] Date of Birth: 06-14-1945   Medicare Important Message Given:  Yes    Orbie Pyo 01/16/2018, 3:13 PM

## 2018-01-16 NOTE — Progress Notes (Signed)
   Subjective:   Patient has had five bowel movements this morning, the last two of which he states was bloody. Small amount of anal leakage on bed. He has had some abdominal pain increased from yesterday but is not requiring his pain medication. He did not eat much breakfast and did not eat lunch but denies nausea.   Objective:  Vital signs in last 24 hours: Vitals:   01/15/18 0509 01/15/18 1544 01/15/18 2154 01/16/18 0514  BP: 133/81 102/77 106/75 120/78  Pulse: 78 (!) 101 85 86  Resp: 16 18    Temp: 98.1 F (36.7 C) 98.1 F (36.7 C) 98.5 F (36.9 C) 98.2 F (36.8 C)  TempSrc: Oral Oral Oral Oral  SpO2: 97% 98% 97% 94%  Weight:      Height:       Physical Exam:  Constitution: NAD, lying supine in bed, normal pallor Cardio: RRR, no m/r/g Respiratory: non-labored breathing Abdominal: mild distention, mildly TTP, hyperactive BS, incision sites c/d/i  Neuro: a&o, cooperative Skin: c/d/i    Assessment/Plan:  Principal Problem:   Small bowel obstruction (HCC) Active Problems:   CAD (coronary artery disease) of artery bypass graft   Carcinoid tumor of ileum  72yo male admitted with PMH of billroth I SBO 2/2 appendiceal obstruction s/p TI &cecum resection 12/16 with carcinoid tumor on pathology.  SBO 2/2 Appendiceal Mass s/p Ileocecectomy 12/16 BM x5 today, two bloody, passing gas. Mildly increased abdominal pain and decreased appetite. Surgery aware per nursing. Already received plavix and asa today, but will hold his lovenox. Second BM in pot appears to have about 100cc dark blood, possibly post-surgical or with increased bowel activity s/p stool softener/laxative in addition to his anticoagulants which were restarted two days ago. Stable and will continue to monitor.   - oxy po q4h prn pain  - advance diet per surgery  - am CBC, BMP - stopped miralax, colace - holding plavix, asa, lovenox  - SCDs ordered   CAD s/p iliac stent placement 06/2017 - holding plavix,  asa - SCDs  Hypertension Cont. Home medications.  JFH:LKTG YBW:LSLH Diet:FLD Code:full  Dispo: Anticipated discharge in approximately 2-3 days.   Tyjuan Demetro A, DO 01/16/2018, 7:10 AM Pager: [phone]

## 2018-01-16 NOTE — Progress Notes (Addendum)
[facility]/Trauma Progress Note  4 Days Post-Op   Assessment/Plan DM HTN CAD s/p cardiac stent placement 06/2017 on plavix - cardiologist is Dr. Gerarda Gunther at [facility] in [place] H/o partial gastrectomy for ulcer disease in 1993 Polyp at/near appendicealorifice polyp  SBO2/2 carcinoid mass S/PLaparoscopic-assisted ileocecectomy12/16/19 Dr. Brantley Stage - path: carcinoid tumor, 0/1 LN involvement - afebrile, VSS - having BM's but no flatus, feels bloated  ID - none currently FEN - IVF, FLD, colace Foley - none Follow up - Dr. Brantley Stage  Plan: Continue ambulating/OOB. Keep on FLD until he has flatus. Encourage protein intake. Will have our office send a referral for oncology follow up  Addendum: 1300 - nurse paged and stated pt had a bloody bowel movement. Will hold plavix for now and recheck Hgb in the am.    LOS: 10 days    Subjective: CC: bloated  Pt states he feels bloated but his pain is minimal. He had a few BM's yesterday and one was normal size but he is not having flatus. He denies nausea, vomiting, belching or hiccups. He has been ambulating.  Objective: Vital signs in last 24 hours: Temp:  [98.1 F (36.7 C)-98.5 F (36.9 C)] 98.2 F (36.8 C) (12/20 0514) Pulse Rate:  [85-101] 86 (12/20 0514) Resp:  [18] 18 (12/19 1544) BP: (102-120)/(75-78) 120/78 (12/20 0514) SpO2:  [94 %-98 %] 94 % (12/20 0514) Last BM Date: 01/15/18  Intake/Output from previous day: 12/19 0701 - 12/20 0700 In: 240 [P.O.:240] Out: -  Intake/Output this shift: No intake/output data recorded.  PE: Gen:  Alert, NAD, pleasant HEENT: pupils equal and round Pulm:  rate and effort normal Abd: Soft, no distension, +BS, incisions with staples intact are without signs of infection, they have surrounding ecchymosis, mild generalized TTP without guarding, no peritonitis    Anti-infectives: Anti-infectives (From admission, onward)   Start     Dose/Rate Route Frequency Ordered  Stop   01/13/18 0600  cefoTEtan (CEFOTAN) 2 g in sodium chloride 0.9 % 100 mL IVPB     2 g 200 mL/hr over 30 Minutes Intravenous On call to O.R. 01/12/18 1205 01/14/18 0559   01/12/18 2359  metroNIDAZOLE (FLAGYL) tablet 1,000 mg  Status:  Discontinued     1,000 mg Oral Every 6 hours 01/11/18 2147 01/12/18 0530   01/12/18 0653  cefoTEtan in Dextrose 5% (CEFOTAN) 2-2.08 GM-%(50ML) IVPB    Note to Pharmacy:  Holtzman, Ariel   : cabinet override      01/12/18 0653 01/12/18 1859   01/12/18 0530  metroNIDAZOLE (FLAGYL) tablet 1,000 mg     1,000 mg Oral  Once 01/12/18 0528 01/12/18 0545   01/12/18 0015  metroNIDAZOLE (FLAGYL) tablet 1,000 mg  Status:  Discontinued     1,000 mg Oral Every 6 hours 01/12/18 0009 01/12/18 0530   01/12/18 0000  neomycin (MYCIFRADIN) tablet 1,000 mg     1,000 mg Oral Every 6 hours 01/11/18 2139     01/11/18 0600  cefoTEtan (CEFOTAN) 2 g in sodium chloride 0.9 % 100 mL IVPB  Status:  Discontinued     2 g 200 mL/hr over 30 Minutes Intravenous On call to O.R. 01/10/18 1828 01/10/18 1912   01/11/18 0600  cefoTEtan (CEFOTAN) 2 g in sodium chloride 0.9 % 100 mL IVPB  Status:  Discontinued     2 g 200 mL/hr over 30 Minutes Intravenous On call to O.R. 01/10/18 1912 01/10/18 1914   01/11/18 0600  cefoTEtan (CEFOTAN) 2 g in sodium chloride 0.9 %  100 mL IVPB     2 g 200 mL/hr over 30 Minutes Intravenous On call to O.R. 01/10/18 1915 01/12/18 0559      Lab Results:  Recent Labs    01/14/18 0340 01/15/18 0423  WBC 9.1 7.4  HGB 12.7* 12.1*  HCT 37.6* 36.7*  PLT 166 176   BMET Recent Labs    01/14/18 0340 01/15/18 0423  NA 139 140  K 3.9 3.5  CL 103 104  CO2 25 28  GLUCOSE 108* 117*  BUN 17 13  CREATININE 1.44* 1.25*  CALCIUM 8.7* 8.7*   PT/INR No results for input(s): LABPROT, INR in the last 72 hours. CMP     Component Value Date/Time   NA 140 01/15/2018 0423   K 3.5 01/15/2018 0423   CL 104 01/15/2018 0423   CO2 28 01/15/2018 0423   GLUCOSE 117 (H)  01/15/2018 0423   BUN 13 01/15/2018 0423   CREATININE 1.25 (H) 01/15/2018 0423   CALCIUM 8.7 (L) 01/15/2018 0423   PROT 5.5 (L) 01/07/2018 0532   ALBUMIN 3.3 (L) 01/07/2018 0532   AST 20 01/07/2018 0532   ALT 21 01/07/2018 0532   ALKPHOS 45 01/07/2018 0532   BILITOT 0.7 01/07/2018 0532   GFRNONAA 57 (L) 01/15/2018 0423   GFRAA >60 01/15/2018 0423   Lipase     Component Value Date/Time   LIPASE 64 (H) 01/06/2018 1245    Studies/Results: No results found.    Kalman Drape , [facility] 01/16/2018, 8:55 AM  Pager: [phone] Mon-Wed, Friday 7:00am-4:30pm Thurs 7am-11:30am  Consults: [phone]

## 2018-01-17 DIAGNOSIS — N179 Acute kidney failure, unspecified: Secondary | ICD-10-CM

## 2018-01-17 LAB — BASIC METABOLIC PANEL
Anion gap: 13 (ref 5–15)
BUN: 34 mg/dL — ABNORMAL HIGH (ref 8–23)
CO2: 26 mmol/L (ref 22–32)
Calcium: 9.2 mg/dL (ref 8.9–10.3)
Chloride: 102 mmol/L (ref 98–111)
Creatinine, Ser: 1.86 mg/dL — ABNORMAL HIGH (ref 0.61–1.24)
GFR calc Af Amer: 41 mL/min — ABNORMAL LOW (ref >60)
GFR calc non Af Amer: 35 mL/min — ABNORMAL LOW (ref >60)
Glucose, Bld: 116 mg/dL — ABNORMAL HIGH (ref 70–99)
Potassium: 4.1 mmol/L (ref 3.5–5.1)
Sodium: 141 mmol/L (ref 135–145)

## 2018-01-17 LAB — CBC
HCT: 38.9 % — ABNORMAL LOW (ref 39.0–52.0)
Hemoglobin: 12.7 g/dL — ABNORMAL LOW (ref 13.0–17.0)
MCH: 29.4 pg (ref 26.0–34.0)
MCHC: 32.6 g/dL (ref 30.0–36.0)
MCV: 90 fL (ref 80.0–100.0)
Platelets: 249 10*3/uL (ref 150–400)
RBC: 4.32 MIL/uL (ref 4.22–5.81)
RDW: 13.1 % (ref 11.5–15.5)
WBC: 9.5 10*3/uL (ref 4.0–10.5)
nRBC: 0 % (ref 0.0–0.2)

## 2018-01-17 LAB — GLUCOSE, CAPILLARY
GLUCOSE-CAPILLARY: 133 mg/dL — AB (ref 70–99)
Glucose-Capillary: 129 mg/dL — ABNORMAL HIGH (ref 70–99)
Glucose-Capillary: 127 mg/dL — ABNORMAL HIGH (ref 70–99)

## 2018-01-17 MED ORDER — SODIUM CHLORIDE 0.9 % IV SOLN
INTRAVENOUS | Status: AC
  Administered 2018-01-17 – 2018-01-18 (×4): via INTRAVENOUS

## 2018-01-17 MED ORDER — CLOPIDOGREL BISULFATE 75 MG PO TABS
75.0000 mg | ORAL_TABLET | Freq: Every day | ORAL | Status: DC
  Administered 2018-01-17: 75 mg via ORAL
  Filled 2018-01-17: qty 1

## 2018-01-17 NOTE — Progress Notes (Signed)
Patient ID: [name], male   DOB: Feb 12, 1945, 72 y.o.   MRN: [id number]   Acute Care Surgery Service Progress Note:    Chief Complaint/Subjective: No more blood in stool Wife at [facility]. Showed me pics of stools. Had a BM this am Not a big appetite. Still having some burping at times but not that much  Objective: Vital signs in last 24 hours: Temp:  [98 F (36.7 C)-98.6 F (37 C)] 98.6 F (37 C) (12/21 0501) Pulse Rate:  [79-94] 79 (12/21 0501) Resp:  [18] 18 (12/20 1604) BP: (100-113)/(76) 100/76 (12/21 0501) SpO2:  [97 %-98 %] 98 % (12/21 0501) Weight:  [79.8 kg] 79.8 kg (12/21 0500) Last BM Date: 01/16/18  Intake/Output from previous day: 12/20 0701 - 12/21 0700 In: 590 [P.O.:590] Out: 200 [Urine:200] Intake/Output this shift: No intake/output data recorded.  Lungs: cta, nonlabored  Cardiovascular: reg  Abd: soft, mild distend, incision ok some bruising, min TTP, hypoBS  Extremities: no edema, +SCDs  Neuro: alert, nonfocal  Lab Results: CBC  Recent Labs    01/16/18 1819 01/17/18 0355  WBC 8.9 9.5  HGB 13.1 12.7*  HCT 38.9* 38.9*  PLT 278 249   BMET Recent Labs    01/15/18 0423 01/17/18 0355  NA 140 141  K 3.5 4.1  CL 104 102  CO2 28 26  GLUCOSE 117* 116*  BUN 13 34*  CREATININE 1.25* 1.86*  CALCIUM 8.7* 9.2   LFT Hepatic Function Latest Ref Rng & Units 01/07/2018 01/06/2018  Total Protein 6.5 - 8.1 g/dL 5.5(L) 7.2  Albumin 3.5 - 5.0 g/dL 3.3(L) 4.3  AST 15 - 41 U/L 20 24  ALT 0 - 44 U/L 21 26  Alk Phosphatase 38 - 126 U/L 45 66  Total Bilirubin 0.3 - 1.2 mg/dL 0.7 0.8   PT/INR No results for input(s): LABPROT, INR in the last 72 hours. ABG No results for input(s): PHART, HCO3 in the last 72 hours.  Invalid input(s): PCO2, PO2  Studies/Results:  Anti-infectives: Anti-infectives (From admission, onward)   Start     Dose/Rate Route Frequency Ordered Stop   01/13/18 0600  cefoTEtan (CEFOTAN) 2 g in sodium chloride 0.9 % 100 mL IVPB      2 g 200 mL/hr over 30 Minutes Intravenous On call to O.R. 01/12/18 1205 01/14/18 0559   01/12/18 2359  metroNIDAZOLE (FLAGYL) tablet 1,000 mg  Status:  Discontinued     1,000 mg Oral Every 6 hours 01/11/18 2147 01/12/18 0530   01/12/18 0653  cefoTEtan in Dextrose 5% (CEFOTAN) 2-2.08 GM-%(50ML) IVPB    Note to Pharmacy:  Holtzman, Ariel   : cabinet override      01/12/18 0653 01/12/18 1859   01/12/18 0530  metroNIDAZOLE (FLAGYL) tablet 1,000 mg     1,000 mg Oral  Once 01/12/18 0528 01/12/18 0545   01/12/18 0015  metroNIDAZOLE (FLAGYL) tablet 1,000 mg  Status:  Discontinued     1,000 mg Oral Every 6 hours 01/12/18 0009 01/12/18 0530   01/12/18 0000  neomycin (MYCIFRADIN) tablet 1,000 mg  Status:  Discontinued     1,000 mg Oral Every 6 hours 01/11/18 2139 01/17/18 0911   01/11/18 0600  cefoTEtan (CEFOTAN) 2 g in sodium chloride 0.9 % 100 mL IVPB  Status:  Discontinued     2 g 200 mL/hr over 30 Minutes Intravenous On call to O.R. 01/10/18 1828 01/10/18 1912   01/11/18 0600  cefoTEtan (CEFOTAN) 2 g in sodium chloride 0.9 % 100 mL  IVPB  Status:  Discontinued     2 g 200 mL/hr over 30 Minutes Intravenous On call to O.R. 01/10/18 1912 01/10/18 1914   01/11/18 0600  cefoTEtan (CEFOTAN) 2 g in sodium chloride 0.9 % 100 mL IVPB     2 g 200 mL/hr over 30 Minutes Intravenous On call to O.R. 01/10/18 1915 01/12/18 0559      Medications: Scheduled Meds: . acetaminophen  650 mg Oral Q6H  . amLODipine  5 mg Oral Daily  . carvedilol  3.12 mg Oral BID WC  . feeding supplement (ENSURE ENLIVE)  237 mL Oral BID BM  . insulin aspart  0-9 Units Subcutaneous TID WC  . lisinopril  20 mg Oral Daily  . pantoprazole (PROTONIX) IV  40 mg Intravenous Q0600   Continuous Infusions: PRN Meds:.docusate, ondansetron **OR** ondansetron (ZOFRAN) IV, polyethylene glycol, traMADol  Assessment/Plan: Patient Active Problem List   Diagnosis Date Noted  . Carcinoid tumor of ileum 01/15/2018  . CAD (coronary artery  disease) of artery bypass graft 01/07/2018  . Small bowel obstruction ([place]) 01/06/2018   DM HTN CAD s/p cardiac stent placement 06/2017 on plavix - cardiologist is Dr. Gerarda Gunther at [facility] in [place] H/o partial gastrectomy for ulcer disease in 1993 Polyp at/near appendicealorifice polyp  SBO2/2 carcinoid mass S/PLaparoscopic-assisted ileocecectomy12/16/19 Dr. Brantley Stage - path: carcinoid tumor, 0/1 LN involvement - afebrile, VSS - having BM's, some burping, no more blood   CV - ok to restart plavix, hgb stable RENAL - good uop, Cr trending up. Defer to  Medicine. Would repeat BMET in am ID -neomycin, ?not sure why on unless holdover from preop; stop neomycin FEN - IVF, FLD, colace VTE prophylaxis - scds, ok to restart plavix, repeat CBC in am Foley - none Follow up - Dr. Brantley Stage   Disposition: soft diet today; told pt to take easy with oral intake, if doesn't feel like eating solids then stick with protein shakes. Told him appetite will be off for awhile; BM consistency will be altered for awhile.  Repeat cbc in am, follow kidney function. Not ready for dc today  LOS: 11 days    Leighton Ruff. Redmond Pulling, MD, FACS General, Bariatric, & Minimally Invasive Surgery [phone] [facility]

## 2018-01-17 NOTE — Progress Notes (Signed)
  Date: 01/17/2018  Patient name: [name]  Medical record number: [id number]  Date of birth: 17-Jul-1945   I have seen and evaluated this patient and I have discussed the plan of care with the house staff. Please see Dr. Maudie Flakes note for complete details. I concur with his findings.  Sid Falcon, MD 01/17/2018, 9:38 PM

## 2018-01-17 NOTE — Progress Notes (Signed)
   Subjective:  3BM's overnight, non bloody.  No increase in pain or discomfort still feels bloated, passing flatus as well.  Still very little appetite and not taking in much fluids either.  Objective:  Vital signs in last 24 hours: Vitals:   01/16/18 0514 01/16/18 1604 01/17/18 0500 01/17/18 0501  BP: 120/78 113/76  100/76  Pulse: 86 94  79  Resp:  18    Temp: 98.2 F (36.8 C) 98 F (36.7 C)  98.6 F (37 C)  TempSrc: Oral Oral  Oral  SpO2: 94% 97%  98%  Weight:   79.8 kg   Height:       Cardiac: normal rate and rhythm, clear s1 and s2, no murmurs, rubs or gallops Pulmonary: CTAB, not in distress Abdominal: non distended abdomen, soft and nontender Extremities: no LE edema Psych: Alert, conversant, in good spirits  Assessment/Plan:  Principal Problem:   Small bowel obstruction (HCC) Active Problems:   CAD (coronary artery disease) of artery bypass graft   Carcinoid tumor of ileum  72yo male admitted with PMH of billroth I SBO 2/2 appendiceal obstruction s/p TI &cecum resection 12/16 with carcinoid tumor on pathology.  SBO 2/2 Appendiceal Mass s/p Ileocecectomy 12/16 BM x5 today, two bloody, passing gas. Mildly increased abdominal pain and decreased appetite. Surgery aware per nursing. Already received plavix and asa today, but will hold his lovenox. Second BM in pot appears to have about 100cc dark blood, possibly post-surgical or with increased bowel activity s/p stool softener/laxative in addition to his anticoagulants which were restarted two days ago. Stable and will continue to monitor.   - oxy po q4h prn pain  - soft diet today - am CBC, BMP -Will begin to add back anticoagulants, will start with plavix alone today per surgery and see how he does - SCDs ordered  - IVF today 2/2 poor oral intake and labs consistent with some prerenal azotemia  CAD s/p iliac stent placement 06/2017: on DAPT at home  -plavix added back today   Hypertension Cont. Home  medications.  ZJQ:BHAL PFX:TKWI Diet:FLD Code:full  Dispo: Anticipated discharge in approximately 2-3 days.   Katherine Roan, MD 01/17/2018, 11:55 AM

## 2018-01-18 LAB — ABO/RH: ABO/RH(D): O NEG

## 2018-01-18 LAB — CBC
HCT: 30.1 % — ABNORMAL LOW (ref 39.0–52.0)
HCT: 29.4 % — ABNORMAL LOW (ref 39.0–52.0)
Hemoglobin: 9.7 g/dL — ABNORMAL LOW (ref 13.0–17.0)
Hemoglobin: 9.5 g/dL — ABNORMAL LOW (ref 13.0–17.0)
MCH: 29.2 pg (ref 26.0–34.0)
MCH: 29.7 pg (ref 26.0–34.0)
MCHC: 32.2 g/dL (ref 30.0–36.0)
MCHC: 32.3 g/dL (ref 30.0–36.0)
MCV: 90.7 fL (ref 80.0–100.0)
MCV: 91.9 fL (ref 80.0–100.0)
Platelets: 209 10*3/uL (ref 150–400)
Platelets: 238 10*3/uL (ref 150–400)
RBC: 3.32 MIL/uL — ABNORMAL LOW (ref 4.22–5.81)
RBC: 3.2 MIL/uL — ABNORMAL LOW (ref 4.22–5.81)
RDW: 13.2 % (ref 11.5–15.5)
RDW: 13.2 % (ref 11.5–15.5)
WBC: 7.7 10*3/uL (ref 4.0–10.5)
WBC: 6 10*3/uL (ref 4.0–10.5)
nRBC: 0 % (ref 0.0–0.2)
nRBC: 0 % (ref 0.0–0.2)

## 2018-01-18 LAB — BASIC METABOLIC PANEL
Anion gap: 7 (ref 5–15)
BUN: 30 mg/dL — ABNORMAL HIGH (ref 8–23)
CO2: 26 mmol/L (ref 22–32)
Calcium: 8.5 mg/dL — ABNORMAL LOW (ref 8.9–10.3)
Chloride: 110 mmol/L (ref 98–111)
Creatinine, Ser: 1.34 mg/dL — ABNORMAL HIGH (ref 0.61–1.24)
GFR calc Af Amer: >60 mL/min (ref >60)
GFR calc non Af Amer: 53 mL/min — ABNORMAL LOW (ref >60)
Glucose, Bld: 112 mg/dL — ABNORMAL HIGH (ref 70–99)
Potassium: 4.1 mmol/L (ref 3.5–5.1)
Sodium: 143 mmol/L (ref 135–145)

## 2018-01-18 LAB — HEMOGLOBIN AND HEMATOCRIT, BLOOD
HCT: 31.1 % — ABNORMAL LOW (ref 39.0–52.0)
Hemoglobin: 10 g/dL — ABNORMAL LOW (ref 13.0–17.0)

## 2018-01-18 LAB — GLUCOSE, CAPILLARY
Glucose-Capillary: 98 mg/dL (ref 70–99)
Glucose-Capillary: 130 mg/dL — ABNORMAL HIGH (ref 70–99)
Glucose-Capillary: 112 mg/dL — ABNORMAL HIGH (ref 70–99)

## 2018-01-18 LAB — TROPONIN I: Troponin I: <0.03 ng/mL (ref <0.03)

## 2018-01-18 MED ORDER — SODIUM CHLORIDE 0.9 % IV BOLUS: 1000.0000 mL | Freq: Once | INTRAVENOUS | Status: DC

## 2018-01-18 MED ORDER — SODIUM CHLORIDE 0.9% IV SOLUTION
Freq: Once | INTRAVENOUS | Status: AC
  Administered 2018-01-18: 13:00:00 via INTRAVENOUS

## 2018-01-18 MED ORDER — ACETAMINOPHEN 325 MG PO TABS: 650.0000 mg | ORAL_TABLET | Freq: Four times a day (QID) | ORAL | Status: DC | PRN

## 2018-01-18 MED ORDER — PANTOPRAZOLE SODIUM 40 MG IV SOLR
40.0000 mg | Freq: Two times a day (BID) | INTRAVENOUS | Status: DC
  Administered 2018-01-18 – 2018-01-19 (×2): 40 mg via INTRAVENOUS
  Filled 2018-01-18 (×2): qty 40

## 2018-01-18 NOTE — Progress Notes (Signed)
  Date: 01/18/2018  Patient name: [name]  Medical record number: [id number]  Date of birth: 1945/11/29   This patient's plan of care was discussed with the house staff. Please see their note for complete details. I concur with their findings.   Sid Falcon, MD 01/18/2018, 3:40 PM

## 2018-01-18 NOTE — Progress Notes (Signed)
   Subjective:   Two blood bm this am, the second was very little. He denies increased abdominal pain or nausea. He ate breakfast and ate twice yesterday without pain or issue. He states he has been dizzy on standing but states this was occurring before he came into the hospital.   Objective:  Vital signs in last 24 hours: Vitals:   01/17/18 0501 01/17/18 1350 01/17/18 2041 01/18/18 0431  BP: 100/76 117/73 107/63 100/71  Pulse: 79 85 78 71  Resp:   16 16  Temp: 98.6 F (37 C) (!) 97.5 F (36.4 C) 98.3 F (36.8 C) 97.7 F (36.5 C)  TempSrc: Oral Oral Oral Oral  SpO2: 98% 98% 98% 97%  Weight:      Height:        Physical Exam: [place]: NAD, sitting at bedside Cardio: RRR, no m/r/g Respiratory: CTA no wheezing or rhonchi Abdominal: +BS, NTTP, soft, non-distended, incision sites c/d/i, some surrounding ecchymosis.  Skin: otherwise c/d/i    Assessment/Plan:  Principal Problem:   Small bowel obstruction (HCC) Active Problems:   CAD (coronary artery disease) of artery bypass graft   Carcinoid tumor of ileum   AKI (acute kidney injury) [facility])  72yo male admitted with PMH of billroth I SBO 2/2 appendiceal obstruction s/p TI &cecum resection 12/16 with carcinoid tumor on pathology.  SBO 2/2 Appendiceal Mass s/p Ileocecectomy 12/16 Blood BM today x2, the second very minimal, surgery aware. Plavix was restarted yesterday but will hold again today. Likely anastomotic leak 2/2 to his plavix. Hemoglobin 9.7 from 12.7 yesterday and blood pressure somewhat soft. Will continue to monitor and check afternoon labs. Discussed with patient and wife and answered questions.   - tramadol po q4h prn pain  - am CBC, afternoon H&H - holding plavix, asa - SCDs ordered  - holding amlodipine, lisinopril - increase protonix to q12h with active bleeding - 1 L bolus - cont. Soft diet, advance as tolerated per surgery - zofran prn  AKI on CKD Creatinine improved from 1.84 yest to 1.3  today, which is his baseline   - 1L bolus for soft BP  - am CMP   CAD s/p iliac stent placement 06/2017: on DAPT at home  - holding plavix, asa   Hypertension Blood pressure soft today, likely secondary to decreased po intake and some blood loss.   - hold amlodipine, lisinopril  - cont. coreg    VTE: SCDs IVF: 1L bolus Diet: soft diet  Code: full   Dispo: Anticipated discharge in approximately 1-2 days.   Molli Hazard A, DO 01/18/2018, 7:16 AM Pager: [phone]

## 2018-01-18 NOTE — Progress Notes (Signed)
6 Days Post-Op   Subjective/Chief Complaint: Had another bloody BM this morning Tolerating PO but appetite decreased   Objective: Vital signs in last 24 hours: Temp:  [97.5 F (36.4 C)-98.3 F (36.8 C)] 97.7 F (36.5 C) (12/22 0431) Pulse Rate:  [71-85] 71 (12/22 0431) Resp:  [16] 16 (12/22 0431) BP: (100-117)/(63-73) 100/71 (12/22 0431) SpO2:  [97 %-98 %] 97 % (12/22 0431) Last BM Date: 01/17/18  Intake/Output from previous day: 12/21 0701 - 12/22 0700 In: 3151.5 [P.O.:720; I.V.:2431.5] Out: -  Intake/Output this shift: No intake/output data recorded.  Exam: Abdomen soft, minimally tender  Lab Results:  Recent Labs    01/17/18 0355 01/18/18 0331  WBC 9.5 7.7  HGB 12.7* 9.7*  HCT 38.9* 30.1*  PLT 249 209   BMET Recent Labs    01/17/18 0355  NA 141  K 4.1  CL 102  CO2 26  GLUCOSE 116*  BUN 34*  CREATININE 1.86*  CALCIUM 9.2   PT/INR No results for input(s): LABPROT, INR in the last 72 hours. ABG No results for input(s): PHART, HCO3 in the last 72 hours.  Invalid input(s): PCO2, PO2  Studies/Results: No results found.  Anti-infectives: Anti-infectives (From admission, onward)   Start     Dose/Rate Route Frequency Ordered Stop   01/13/18 0600  cefoTEtan (CEFOTAN) 2 g in sodium chloride 0.9 % 100 mL IVPB     2 g 200 mL/hr over 30 Minutes Intravenous On call to O.R. 01/12/18 1205 01/14/18 0559   01/12/18 2359  metroNIDAZOLE (FLAGYL) tablet 1,000 mg  Status:  Discontinued     1,000 mg Oral Every 6 hours 01/11/18 2147 01/12/18 0530   01/12/18 0653  cefoTEtan in Dextrose 5% (CEFOTAN) 2-2.08 GM-%(50ML) IVPB    Note to Pharmacy:  Holtzman, Ariel   : cabinet override      01/12/18 0653 01/12/18 1859   01/12/18 0530  metroNIDAZOLE (FLAGYL) tablet 1,000 mg     1,000 mg Oral  Once 01/12/18 0528 01/12/18 0545   01/12/18 0015  metroNIDAZOLE (FLAGYL) tablet 1,000 mg  Status:  Discontinued     1,000 mg Oral Every 6 hours 01/12/18 0009 01/12/18 0530   01/12/18 0000  neomycin (MYCIFRADIN) tablet 1,000 mg  Status:  Discontinued     1,000 mg Oral Every 6 hours 01/11/18 2139 01/17/18 0911   01/11/18 0600  cefoTEtan (CEFOTAN) 2 g in sodium chloride 0.9 % 100 mL IVPB  Status:  Discontinued     2 g 200 mL/hr over 30 Minutes Intravenous On call to O.R. 01/10/18 1828 01/10/18 1912   01/11/18 0600  cefoTEtan (CEFOTAN) 2 g in sodium chloride 0.9 % 100 mL IVPB  Status:  Discontinued     2 g 200 mL/hr over 30 Minutes Intravenous On call to O.R. 01/10/18 1912 01/10/18 1914   01/11/18 0600  cefoTEtan (CEFOTAN) 2 g in sodium chloride 0.9 % 100 mL IVPB     2 g 200 mL/hr over 30 Minutes Intravenous On call to O.R. 01/10/18 1915 01/12/18 0559      Assessment/Plan: s/p Procedure(s): LAPAROSCOPIC ASSISTED PARTIAL COLECTOMY (N/A)  Anastomotic bleed.  Will have to stop plavix.  Repeat CBC in the morning.  This should stop on its own without the need for intervention.  Bolus IVF  LOS: 12 days    Coralie Keens 01/18/2018

## 2018-01-18 NOTE — Plan of Care (Signed)
Discussed how plavix works, how new platelets can reverse bleeding tendencies.  Clot prevention with walking & SCD's.

## 2018-01-18 NOTE — Progress Notes (Signed)
Patient ID: [name], male   DOB: 05/16/1945, 73 y.o.   MRN: [id number]   Because of on going bleeding from the anastomosis, will transfuse platelets given plavix. May need PRBC's if Hgb lower.  He is in agreement.

## 2018-01-19 DIAGNOSIS — Z91040 Latex allergy status: Secondary | ICD-10-CM

## 2018-01-19 DIAGNOSIS — K921 Melena: Secondary | ICD-10-CM

## 2018-01-19 LAB — BASIC METABOLIC PANEL
Anion gap: 7 (ref 5–15)
BUN: 22 mg/dL (ref 8–23)
CO2: 26 mmol/L (ref 22–32)
Calcium: 8.3 mg/dL — ABNORMAL LOW (ref 8.9–10.3)
Chloride: 112 mmol/L — ABNORMAL HIGH (ref 98–111)
Creatinine, Ser: 1.22 mg/dL (ref 0.61–1.24)
GFR calc Af Amer: >60 mL/min (ref >60)
GFR calc non Af Amer: 59 mL/min — ABNORMAL LOW (ref >60)
Glucose, Bld: 93 mg/dL (ref 70–99)
Potassium: 3.9 mmol/L (ref 3.5–5.1)
Sodium: 145 mmol/L (ref 135–145)

## 2018-01-19 LAB — CBC
HCT: 26.8 % — ABNORMAL LOW (ref 39.0–52.0)
HCT: 29.4 % — ABNORMAL LOW (ref 39.0–52.0)
Hemoglobin: 8.9 g/dL — ABNORMAL LOW (ref 13.0–17.0)
Hemoglobin: 9.6 g/dL — ABNORMAL LOW (ref 13.0–17.0)
MCH: 30.4 pg (ref 26.0–34.0)
MCH: 30.1 pg (ref 26.0–34.0)
MCHC: 33.2 g/dL (ref 30.0–36.0)
MCHC: 32.7 g/dL (ref 30.0–36.0)
MCV: 91.5 fL (ref 80.0–100.0)
MCV: 92.2 fL (ref 80.0–100.0)
Platelets: 220 10*3/uL (ref 150–400)
Platelets: 242 10*3/uL (ref 150–400)
RBC: 2.93 MIL/uL — ABNORMAL LOW (ref 4.22–5.81)
RBC: 3.19 MIL/uL — ABNORMAL LOW (ref 4.22–5.81)
RDW: 13.2 % (ref 11.5–15.5)
RDW: 13.2 % (ref 11.5–15.5)
WBC: 5.6 10*3/uL (ref 4.0–10.5)
WBC: 5.7 10*3/uL (ref 4.0–10.5)
nRBC: 0 % (ref 0.0–0.2)
nRBC: 0 % (ref 0.0–0.2)

## 2018-01-19 LAB — BPAM PLATELET PHERESIS
BLOOD PRODUCT EXPIRATION DATE: 201912232359
ISSUE DATE / TIME: 201912221310
Unit Type and Rh: 6200

## 2018-01-19 LAB — PREPARE PLATELET PHERESIS: Unit division: 0

## 2018-01-19 LAB — GLUCOSE, CAPILLARY
Glucose-Capillary: 86 mg/dL (ref 70–99)
Glucose-Capillary: 136 mg/dL — ABNORMAL HIGH (ref 70–99)

## 2018-01-19 MED ORDER — PANTOPRAZOLE SODIUM 40 MG PO TBEC: 40.0000 mg | DELAYED_RELEASE_TABLET | Freq: Two times a day (BID) | ORAL | Status: DC

## 2018-01-19 MED ORDER — SODIUM CHLORIDE 0.9 % IV SOLN
510.0000 mg | Freq: Once | INTRAVENOUS | Status: AC
  Administered 2018-01-19: 510 mg via INTRAVENOUS
  Filled 2018-01-19 (×2): qty 17

## 2018-01-19 MED ORDER — PANTOPRAZOLE SODIUM 40 MG PO TBEC: 40.0000 mg | DELAYED_RELEASE_TABLET | Freq: Two times a day (BID) | ORAL | 0 refills | Status: DC

## 2018-01-19 NOTE — Progress Notes (Signed)
Subjective: Mr. Requena denies any further episodes of hematochezia though he has not had a BM yet. Marland Kitchen He reports that he feels well today, tolerating diet without any difficulties. He indicates that he was told his episode of hematochezia on Sunday was due to "old blood."  Patient denies any other symptoms such as chest pain, shortness of breath, or abdominal pain.  He also denies any lightheadedness or dizziness.  He reported that he would like to go home however we discussed that we would like to see how he is doing today and what surgery recommends.  In agreement with the plan.  Objective:  Vital signs in last 24 hours: Vitals:   01/18/18 1332 01/18/18 1500 01/18/18 2106 01/19/18 0501  BP: 111/66 124/77 123/74 114/80  Pulse: 72 82 87 69  Resp:  16 20 12   Temp: (!) 97.5 F (36.4 C) 98.5 F (36.9 C) 97.9 F (36.6 C) (!) 97.5 F (36.4 C)  TempSrc: Axillary Oral Oral Oral  SpO2: 98% 96% 97% 97%  Weight:    82 kg  Height:        General: Well nourished, well appearing, NAD  Cardiac: RRR, normal S1, S2, no murmurs, rubs or gallops  Pulmonary: Lungs CTA bilaterally, no wheezing, rhonchi or rales  Abdomen: Soft, nontender, surgical midline scar is well-healing, no drainage or erythema around wound. Extremity: No LE edema, no muscle atrophy, no lesions or wounds noted  Psychiatry: Normal mood and affect     Assessment/Plan:  Principal Problem:   Small bowel obstruction (HCC) Active Problems:   CAD (coronary artery disease) of artery bypass graft   Carcinoid tumor of ileum   AKI (acute kidney injury) [facility])  This is a 72 year old male with a history of carcinoid tumors who presented with a small bowel obstruction and is now status post ileostomy ectomy with anastomosis on 12/16.  He developed bloody bowel movements on the 20th that initially resolved.  He was restarted on his Plavix and he developed 2 episodes of bloody bowel movements yesterday.  Small bowel obstruction secondary  to appendiceal mass status post ileocecectomy on 12/16: Patient developed 2 bloody bowel movements yesterday afternoon, Plavix had been restarted that day now being held.  Think that this is likely due to an anastomotic leak secondary to restarting his Plavix.  Today his hemoglobin is 8.9, slight decrease from 9.7 today.  He denied any bloody bowel movements this morning however did not have any blood movements yet.  We will continue to monitor and repeat labs this afternoon.  If labs are stable today he may be discharged. -Continue tramadol every 4 PRN for pain -Repeat CBC in afternoon -CBGs in place -Need to hold Plavix and aspirin -Continue to hold amlodipine and lisinopril -Continue Protonix twice daily  AKI on CKD: NA today as 1.2 which is close to his baseline.  He received 1 L of normal saline yesterday for soft blood pressures.  Blood pressures have been stable today. -Repeat BMP in AM if patient is still here  Hypertension: Patient is on amlodipine, lisinopril and Coreg at home.  Blood pressures have been on the softer side so amlodipine and lisinopril have been held.  Blood pressures today are still on the lower side, around 150 14/80.  We will continue to hold amlodipine and lisinopril. -Continue Coreg  FEN: Fluids, replete lytes prn, soft diet VTE ppx: SCDs Code Status: FULL   Dispo: Anticipated discharge in approximately today if Hgb is stable.   Lonia Skinner  M, MD 01/19/2018, 6:41 AM Pager: [phone]

## 2018-01-19 NOTE — Progress Notes (Addendum)
7 Days Post-Op  Subjective: No blood in stools yesterday. Asking when he can go home Tolerating solid diet Ambulating in hall Pain well controlled. Creatinine down to 1.22.  Potassium 3.9.  BUN 22.  Glucose 93. Hemoglobin 8.9 this morning.  9.5 yesterday afternoon.  10.0 at noon yesterday More than likely equilibrating  Objective: Vital signs in last 24 hours: Temp:  [97.5 F (36.4 C)-98.5 F (36.9 C)] 97.5 F (36.4 C) (12/23 0501) Pulse Rate:  [69-87] 69 (12/23 0501) Resp:  [12-20] 12 (12/23 0501) BP: (111-132)/(65-80) 114/80 (12/23 0501) SpO2:  [96 %-100 %] 97 % (12/23 0501) Last BM Date: 01/18/18  Intake/Output from previous day: 12/22 0701 - 12/23 0700 In: 1906 [P.O.:720; I.V.:50; Blood:236; IV Piggyback:900] Out: 1200 [Stool:1200] Intake/Output this shift: No intake/output data recorded.  General appearance: Alert.  Mental status normal.  Comfortable.  No distress whatsoever. Resp: clear to auscultation bilaterally GI: Soft.  Nondistended.  No significant tenderness.  All wounds look good with staples in place.  Some ecchymoses but no hematoma  Lab Results:  Results for orders placed or performed during the hospital encounter of 01/06/18 (from the past 24 hour(s))  Basic metabolic panel     Status: Abnormal   Collection Time: 01/18/18  8:03 AM  Result Value Ref Range   Sodium 143 135 - 145 mmol/L   Potassium 4.1 3.5 - 5.1 mmol/L   Chloride 110 98 - 111 mmol/L   CO2 26 22 - 32 mmol/L   Glucose, Bld 112 (H) 70 - 99 mg/dL   BUN 30 (H) 8 - 23 mg/dL   Creatinine, Ser 1.34 (H) 0.61 - 1.24 mg/dL   Calcium 8.5 (L) 8.9 - 10.3 mg/dL   GFR calc non Af Amer 53 (L) >60 mL/min   GFR calc Af Amer >60 >60 mL/min   Anion gap 7 5 - 15  Glucose, capillary     Status: None   Collection Time: 01/18/18  8:09 AM  Result Value Ref Range   Glucose-Capillary 98 70 - 99 mg/dL  Troponin I - Once     Status: None   Collection Time: 01/18/18 12:03 PM  Result Value Ref Range   Troponin  I <0.03 <0.03 ng/mL  Hemoglobin and hematocrit, blood     Status: Abnormal   Collection Time: 01/18/18 12:03 PM  Result Value Ref Range   Hemoglobin 10.0 (L) 13.0 - 17.0 g/dL   HCT 31.1 (L) 39.0 - 52.0 %  Glucose, capillary     Status: Abnormal   Collection Time: 01/18/18 12:15 PM  Result Value Ref Range   Glucose-Capillary 130 (H) 70 - 99 mg/dL  Prepare Pheresed Platelets     Status: None (Preliminary result)   Collection Time: 01/18/18 12:25 PM  Result Value Ref Range   Unit Number [id number]    Blood Component Type PLTPHER LR2    Unit division 00    Status of Unit ISSUED    Transfusion Status      OK TO TRANSFUSE Performed at [facility], [place] [address] [place], [place] [address]   CBC     Status: Abnormal   Collection Time: 01/18/18  4:44 PM  Result Value Ref Range   WBC 6.0 4.0 - 10.5 K/uL   RBC 3.20 (L) 4.22 - 5.81 MIL/uL   Hemoglobin 9.5 (L) 13.0 - 17.0 g/dL   HCT 29.4 (L) 39.0 - 52.0 %   MCV 91.9 80.0 - 100.0 fL   MCH 29.7 26.0 - 34.0  pg   MCHC 32.3 30.0 - 36.0 g/dL   RDW 13.2 11.5 - 15.5 %   Platelets 238 150 - 400 K/uL   nRBC 0.0 0.0 - 0.2 %  Glucose, capillary     Status: Abnormal   Collection Time: 01/18/18  5:44 PM  Result Value Ref Range   Glucose-Capillary 112 (H) 70 - 99 mg/dL  CBC     Status: Abnormal   Collection Time: 01/19/18  3:43 AM  Result Value Ref Range   WBC 5.6 4.0 - 10.5 K/uL   RBC 2.93 (L) 4.22 - 5.81 MIL/uL   Hemoglobin 8.9 (L) 13.0 - 17.0 g/dL   HCT 26.8 (L) 39.0 - 52.0 %   MCV 91.5 80.0 - 100.0 fL   MCH 30.4 26.0 - 34.0 pg   MCHC 33.2 30.0 - 36.0 g/dL   RDW 13.2 11.5 - 15.5 %   Platelets 220 150 - 400 K/uL   nRBC 0.0 0.0 - 0.2 %  Basic metabolic panel     Status: Abnormal   Collection Time: 01/19/18  3:43 AM  Result Value Ref Range   Sodium 145 135 - 145 mmol/L   Potassium 3.9 3.5 - 5.1 mmol/L   Chloride 112 (H) 98 - 111 mmol/L   CO2 26 22 - 32 mmol/L   Glucose, Bld 93 70 - 99 mg/dL   BUN 22 8 - 23 mg/dL    Creatinine, Ser 1.22 0.61 - 1.24 mg/dL   Calcium 8.3 (L) 8.9 - 10.3 mg/dL   GFR calc non Af Amer 59 (L) >60 mL/min   GFR calc Af Amer >60 >60 mL/min   Anion gap 7 5 - 15     Studies/Results: No results found.  . carvedilol  3.12 mg Oral BID WC  . feeding supplement (ENSURE ENLIVE)  237 mL Oral BID BM  . insulin aspart  0-9 Units Subcutaneous TID WC  . pantoprazole (PROTONIX) IV  40 mg Intravenous Q12H     Assessment/Plan: s/p Procedure(s) Laparoscopic assisted ileocecal ectomy, 01/12/2018, Dr. Brantley Stage  Carcinoid tumor of ileum Small bowel obstruction, resolved DM HTN CAD s/p cardiac stent placement 06/2017 on plavix - cardiologist is Dr. Gerarda Gunther at [facility] in [place] H/o partial gastrectomy for ulcer disease in 1993 Polyp at/near appendicealorifice polyp  SBO2/2 carcinoid mass S/PLaparoscopic-assisted ileocecectomy12/16/19 Dr. Brantley Stage - path: carcinoid tumor, 0/1 LN involvement - afebrile, VSS - having BM's, some burping, no more blood   CV -holding Plavix due to postop GI bleed.  Now resolved.  Probably can restart Plavix in a day or 2 RENAL - good uop, Cr trending up. Defer to  Medicine.  ID -no antibiotics FEN - IVF, FLD, colace VTE prophylaxis -no pharmacologic prophylaxis due to GI bleeding Foley - none Follow up - Dr. Brantley Stage  Disposition:    -Continue soft diet.    - Probably has stopped bleeding.  Check CBC at noon.  Discharge home this afternoon if remains stable                                                                                   - if hemoglobin stable at noon, then he meets all surgical discharge  criteria                                       -discharge instructions and follow-up with [facility] loaded into discharge navigator - I discussed diet, activities, wound care with him     @PROBHOSP @  LOS: 13 days    Adin Hector 01/19/2018  . .prob

## 2018-01-20 NOTE — Discharge Summary (Addendum)
Name: [name] MRN: [id number] DOB: Aug 04, 1945 72 y.o. PCP: Jefm Petty, MD  Date of Admission: 01/06/2018 11:57 AM Date of Discharge: 01/19/2018 Attending Physician: Oda Kilts, MD  Discharge Diagnosis: 1.  Small bowel obstruction secondary to appendiceal mass s/p ileocectomy on 12/16 2.  Hematochezia  3.  AKI 4.  Hypertension  Discharge Medications: Allergies as of 01/19/2018      Reactions   Latex Itching      Medication List    STOP taking these medications   amLODipine 10 MG tablet Commonly known as:  NORVASC   clopidogrel 75 MG tablet Commonly known as:  PLAVIX   lisinopril 20 MG tablet Commonly known as:  PRINIVIL,ZESTRIL   omeprazole 20 MG capsule Commonly known as:  PRILOSEC     TAKE these medications   carvedilol 6.25 MG tablet Commonly known as:  COREG Take 3.12 mg by mouth 2 (two) times daily with a meal.   DULoxetine 30 MG capsule Commonly known as:  CYMBALTA Take 30 mg by mouth daily.   metFORMIN 500 MG 24 hr tablet Commonly known as:  GLUCOPHAGE-XR Take 500 mg by mouth at bedtime.   MULTIVITAMIN PO Take 1 tablet by mouth 2 (two) times daily.   pantoprazole 40 MG tablet Commonly known as:  PROTONIX Take 1 tablet (40 mg total) by mouth every 12 (twelve) hours.   pravastatin 40 MG tablet Commonly known as:  PRAVACHOL Take 40 mg by mouth daily.   tamsulosin 0.4 MG Caps capsule Commonly known as:  FLOMAX Take 0.4 mg by mouth daily.   VITAMIN B 12 PO Take 2 tablets by mouth at bedtime.       Disposition and follow-up:   Mr.[name] was discharged from [facility] in Good condition.  At the hospital follow up visit please address:  1.  Small bowel obstruction secondary to appendiceal mass s/p ileocectomy on 12/16: Patient should have oncology follow-up.  Hematochezia: Please assess if he is having any more bloody bowel movements after starting Plavix.   AKI: Resolved, repeat  BMP  Hypertension: Lisinopril and amlodipine were discontinued on discharge, Coreg was given in the hospital and his blood pressure remained normal.  Please reassess if lisinopril or amlodipine need to be started.  2.  Labs / imaging needed at time of follow-up: BMP  3.  Pending labs/ test needing follow-up: None  Follow-up Appointments: Follow-up Information    Erroll Luna, MD. Go on 02/09/2018.   Specialty:  General Surgery Why:  Your appointment is 02/09/18 at 10:40AM to follow up from your recent surgery. Please arrive 20 minutes early to check in. Contact information: [address] [place] [address] [phone]        [facility], [place]. Go on 01/26/2018.   Specialty:  General Surgery Why:  this is a nurse only visit for staple removal.  Appointment at 10:30am, arrive by 10:00am for paperwork and check in Contact information: [address] [place] [phone]       Oncology Follow up.   Why:  Dr. Josetta Huddle office is arranging outpatient follow up with an oncologist for you.  Their office will contact you with appointment date and time       Jefm Petty, MD Follow up in 3 week(s).   Specialty:  Family Medicine Why:  BP med adjustment Contact information: [place] [address] [place] [address] [phone]  Hospital Course by problem list: 1.  Small bowel obstruction secondary to appendiceal mass s/p ileocectomy on 12/16: This is a 72 year old male with history of diabetes, hypertension, coronary artery disease status post cardiac stent placement on 6/19 was on Plavix who presented abdominal pain.  He had multiple episodes of nonbilious nonbloody vomiting, nausea, and lightheadedness.  He had seen by surgery on June 2019 biopsies for tubular adenoma, they had planned to perform resection next year due to him being on Plavix for a coronary artery event status post  stent placement.  However, due to recurrent SBO, decision was made to proceed with surgery.    He had an ileocecectomy with anastomosis 01/12/2018.  Patient tolerated the procedure well and Plavix was restarted on 12/18.  Pathology showed carcinoid tumor, T2 staging pathology with a negative single lymph node.  Patient started having bloody bowel movements after restarting his plavix, vitals have remained stable.  This was thought to be due to anastomotic bleeding.  Plavix was held and bleeding resolved.  On restarting Plavix he again had 2 episodes of bloody bowel movements so Plavix was held again.  Hemoglobin dropped down to 9.7.  Protonix was increased to every 12 hours.  Hemoglobin remained stable and did have any bloody bowel movements after holding Plavix.  He was given platelets.  Patient was not having any abdominal pain.  Patient was discharged home with holding Plavix for the next 3 days and continuing Protonix twice daily.  2.  Hematochezia: Patient developed bloody bowel movements following day ileocecectomy.  This resolved after Plavix was held however once Plavix was restarted it recurred again.  He remained hemodynamically stable and his hemoglobin did drop down to 8.9 however remained stable on discharge.  Plavix was held for the next 3 days and patient was continued on Protonix.  3.  AKI: Creatinine increased to 1.5 baseline around 1.2.  He was given fluids and creatinine improved down to his baseline.  4.  Hypertension: He is on amlodipine 10 mg, lisinopril and carvedilol 6.25 mg twice daily at home.  His blood pressures had been on the softer side during admission he he was discontinued on Coreg.  Lisinopril and amlodipine were held on discharge and patient will follow-up with his PCP to discuss restarting.  Discharge Vitals:   BP 127/64 (BP Location: Left Arm)   Pulse 84   Temp (!) 97.5 F (36.4 C) (Oral)   Resp 18   Ht 5\' 10"  (1.778 m)   Wt 82 kg   SpO2 98%   BMI 25.94 kg/m    Pertinent Labs, Studies, and Procedures:  CBC Latest Ref Rng & Units 01/19/2018 01/19/2018 01/18/2018  WBC 4.0 - 10.5 K/uL 5.7 5.6 6.0  Hemoglobin 13.0 - 17.0 g/dL 9.6(L) 8.9(L) 9.5(L)  Hematocrit 39.0 - 52.0 % 29.4(L) 26.8(L) 29.4(L)  Platelets 150 - 400 K/uL 242 220 238   BMP Latest Ref Rng & Units 01/19/2018 01/18/2018 01/17/2018  Glucose 70 - 99 mg/dL 93 112(H) 116(H)  BUN 8 - 23 mg/dL 22 30(H) 34(H)  Creatinine 0.61 - 1.24 mg/dL 1.22 1.34(H) 1.86(H)  Sodium 135 - 145 mmol/L 145 143 141  Potassium 3.5 - 5.1 mmol/L 3.9 4.1 4.1  Chloride 98 - 111 mmol/L 112(H) 110 102  CO2 22 - 32 mmol/L 26 26 26   Calcium 8.9 - 10.3 mg/dL 8.3(L) 8.5(L) 9.2   CT abdomen pelvis with contrast IMPRESSION: Diffuse small bowel dilatation is noted without definite transition zone seen. This is concerning for distal  small bowel obstruction although ileus cannot be excluded. Follow-up radiographs are recommended. No colonic dilatation is noted.  Stable left adrenal adenoma.  Pathology: Reviewed intestinal segment including 9 cm of terminal ileum and 4.5 cm of cecum, as well as normal-appearing appendix.  Well-differentiated neuroendocrine tumor (carcinoid tumor), spanning 1.2 cm in the terminal ileum.  Tumor involves muscularis propria.  Lymphovascular invasion is identified.  There is no evidence of tumor in 1 of 1 lymph node.  Surgical margins are negative for tumor.  Discharge Instructions: Discharge Instructions    Call MD for:  difficulty breathing, headache or visual disturbances   Complete by:  As directed    Call MD for:  extreme fatigue   Complete by:  As directed    Call MD for:  hives   Complete by:  As directed    Call MD for:  persistant dizziness or light-headedness   Complete by:  As directed    Call MD for:  persistant nausea and vomiting   Complete by:  As directed    Call MD for:  redness, tenderness, or signs of infection (pain, swelling, redness, odor or green/yellow  discharge around incision site)   Complete by:  As directed    Call MD for:  severe uncontrolled pain   Complete by:  As directed    Call MD for:  temperature >100.4   Complete by:  As directed    Diet - low sodium heart healthy   Complete by:  As directed    Discharge instructions   Complete by:  As directed    Mr. Clingenpeel,  I am glad that you are feeling better. You were hospitalized because you had a small bowel obstruction and had removal of some of your intestines after which you started having blood bowel movements. We checked you blood counts before you left and they look good.  -Please stop taking the plavix for the next 3 days, you can continue it after the 3 days.  -Please start taking protonix twice a day, stop taking the omeprazole.  Your blood pressure was normal while you were in the hospital, please continue taking the Coreg and stop taking your amlodipine, lisinopril until you follow up with your PCP.   If you continue to have bloody bowel movements, start having fevers, chills, light headedness or dizziness please return to the hospital to be evaluated. Please follow up with you primary care provider and surgeon, the information is in the follow up provider section. Have a happy holiday!   Increase activity slowly   Complete by:  As directed       Signed: Asencion Noble, MD 01/20/2018, 6:34 AM   Pager: [phone]    Internal Medicine Attending Note:  I saw and examined the patient on the day of discharge. I reviewed and agree with the discharge summary written by the house staff.  Lenice Pressman, M.D., Ph.D.

## 2018-01-27 ENCOUNTER — Encounter: Payer: Self-pay | Admitting: Oncology

## 2018-01-27 ENCOUNTER — Telehealth: Payer: Self-pay | Admitting: Oncology

## 2018-01-27 NOTE — Telephone Encounter (Signed)
New referral received from Dr. Dema Severin for neuroendocrine tumor. Pt has been cld and scheduled to see Dr. Benay Spice on 1/20 at 2pm. I lft the appt information on the pt's vm. Letter mailed.

## 2018-02-09 ENCOUNTER — Other Ambulatory Visit (HOSPITAL_COMMUNITY): Payer: Self-pay | Admitting: Internal Medicine

## 2018-02-16 ENCOUNTER — Inpatient Hospital Stay: Payer: Medicare HMO | Attending: Oncology | Admitting: Oncology

## 2018-02-16 ENCOUNTER — Telehealth: Payer: Self-pay

## 2018-02-16 ENCOUNTER — Telehealth: Payer: Self-pay | Admitting: Oncology

## 2018-02-16 VITALS — BP 153/84 | HR 96 | Temp 97.6°F | Resp 18 | Ht 70.0 in | Wt 185.1 lb

## 2018-02-16 DIAGNOSIS — C7B01 Secondary carcinoid tumors of distant lymph nodes: Secondary | ICD-10-CM

## 2018-02-16 DIAGNOSIS — C7A012 Malignant carcinoid tumor of the ileum: Secondary | ICD-10-CM

## 2018-02-16 DIAGNOSIS — I2510 Atherosclerotic heart disease of native coronary artery without angina pectoris: Secondary | ICD-10-CM

## 2018-02-16 DIAGNOSIS — E119 Type 2 diabetes mellitus without complications: Secondary | ICD-10-CM

## 2018-02-16 DIAGNOSIS — I10 Essential (primary) hypertension: Secondary | ICD-10-CM

## 2018-02-16 NOTE — Progress Notes (Signed)
[facility] New Patient Consult   Requesting MD: Jefm Petty, [address] [place], [place] [address]   [name] 73 y.o.  [birth year]    Reason for Consult: Carcinoid tumor   HPI: [name] underwent a screening colonoscopy on 04/30/2017.  A "villous adenoma "was seen at the appendiceal orifice.  The lesion was biopsied, but not removed.  The terminal ileum appeared normal.  A biopsy was obtained.  2 polyps were removed from the ascending colon.  The ascending colon polyps returned as tubular adenomas.  The biopsy from the appendiceal orifice returned as a tubular adenoma.  The terminal ileum biopsy revealed small intestinal mucosa and a few lymphoid aggregates.  He was referred to Dr. Dema Severin for removal of the appendiceal orifice tubular adenoma.  Surgery was placed on hold secondary to the need for anticoagulation with a cardiac stent.  A coronary stent was placed in July 2019.  Mr. was admitted 01/06/2018 with a small bowel obstruction.  He was taken off of anticoagulation therapy.  He was taken to the operating room by Dr. Brantley Stage on 01/12/2018 for a laparoscopic assisted ileocecectomy.  A mass was noted in the distal ileum at 3 cm from the ileocecal valve.  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  This was the point of obstruction.  No evidence of metastatic disease.  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  The KI-67 returned at 1%.  Tumor extended into the muscularis propria.  The resection margins were negative.  Lymphovascular invasion was noted.  One lymph node was negative for metastatic carcinoma.  He had GI bleeding after restarting Plavix.  Past Medical History:  Diagnosis Date  . Diabetes mellitus without complication ([place])   . Hyperlipidemia   . Hypertension     .  "Pinched nerve "in the neck   .  Coronary artery disease-stent in  place   .   Kidney stones  Past Surgical History:  Procedure Laterality Date  . ABDOMINAL SURGERY    . LAPAROSCOPIC PARTIAL COLECTOMY N/A 01/12/2018   Procedure: LAPAROSCOPIC ASSISTED PARTIAL COLECTOMY;  Surgeon: Erroll Luna, MD;  Location: [facility];  Service: General;  Laterality: N/A;    .  Partial gastrectomy 1993   .  Skull surgery following trauma   .  L3-L4 disc surgery   .  Bilateral hip replacement   .  Left rotator cuff surgery   .  Bilateral carpal tunnel surgery   .  Left knee meniscus surgery  Medications: Reviewed  Allergies:  Allergies  Allergen Reactions  . Latex Itching    Blisters skin     Social History:   He lives with his wife in [place].  He is a retired from [employer] job.  He does not smoke cigarettes.  He reports rare alcohol use.  He received a transfusion following trauma in [place].  ROS:   Positives include: 2-6 loose bowel movements per day since undergoing gastric surgery in 1993, right conjunctival hemorrhage following treatment for macular degeneration on 02/13/2018  A complete ROS was otherwise negative.  Physical Exam:  Blood pressure (!) 153/84, pulse 96, temperature 97.6 F (36.4 C), temperature source Oral, resp. rate 18, height 5' 10"  (1.778 m), weight 185 lb 1.6 oz (84 kg), SpO2 99 %.  HEENT: Oropharynx without visible mass, neck without mass Lungs: Clear bilaterally Cardiac: Regular rate and rhythm Abdomen: No hepatosplenomegaly, no mass,  nontender  Vascular: No leg edema Lymph nodes: No cervical, supraclavicular, axillary, or inguinal nodes Neurologic: Alert and oriented, the motor exam appears intact in the upper and lower extremities bilaterally Skin: No rash Musculoskeletal: No spine tenderness   LAB:  CBC  Lab Results  Component Value Date   WBC 5.7 01/19/2018   HGB 9.6 (L) 01/19/2018   HCT 29.4 (L) 01/19/2018   MCV 92.2 01/19/2018   PLT 242 01/19/2018   NEUTROABS 3.2 01/09/2018        CMP  Lab Results    Component Value Date   NA 145 01/19/2018   K 3.9 01/19/2018   CL 112 (H) 01/19/2018   CO2 26 01/19/2018   GLUCOSE 93 01/19/2018   BUN 22 01/19/2018   CREATININE 1.22 01/19/2018   CALCIUM 8.3 (L) 01/19/2018   PROT 5.5 (L) 01/07/2018   ALBUMIN 3.3 (L) 01/07/2018   AST 20 01/07/2018   ALT 21 01/07/2018   ALKPHOS 45 01/07/2018   BILITOT 0.7 01/07/2018   GFRNONAA 59 (L) 01/19/2018   GFRAA >60 01/19/2018      Imaging: CT abdomen/pelvis 01/06/2018-images reviewed    Assessment/Plan:   1. 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 Admission 01/06/2018 with a small bowel obstruction secondary to #1 3. Coronary artery disease 4. Diabetes 5. Hypertension 6. History of kidney stones 7. Macular degeneration   Disposition:   [name] has a complex medical history.  He was diagnosed with a carcinoid tumor of the terminal ileum after presenting with a small bowel obstruction last month.  There is no clinical or CT evidence of metastatic disease.  I recommend observation.  [name] will return for an office visit and chromogranin A level in 9 months. He was referred to Dr. Dema Severin for surgical removal of a tubular adenoma at the appendiceal orifice.  There is no mention of a tubular adenoma in the appendix resection specimen.  We will ask for further pathology review.    Betsy Coder, MD  02/16/2018, 2:07 PM

## 2018-02-16 NOTE — Patient Instructions (Signed)
Please provide a copy of your medical Advanced Directive/Living Will to have scanned into your chart.

## 2018-02-16 NOTE — Telephone Encounter (Signed)
  Oncology Nurse Navigator Documentation     Attempted to call patient to remind him of initial consult with Dr. Benay Spice today at 2 PM. Generic HIPPA compliant voice mail left requesting a call back to my direct phone line.

## 2018-02-16 NOTE — Progress Notes (Signed)
  Oncology Nurse Navigator Documentation  Met with [name] at initial med/onc consult. Written information from Cancer.gov related to gastrointestinal neuroendocrine tumors provided. Contact information provided for [facility] team members. Will follow as needed.

## 2018-02-16 NOTE — Telephone Encounter (Signed)
Gave patient avs report and appointments for October  °

## 2018-02-18 NOTE — Telephone Encounter (Signed)
Opened in error

## 2018-05-22 ENCOUNTER — Ambulatory Visit: Payer: Self-pay | Admitting: Orthopedic Surgery

## 2018-06-02 ENCOUNTER — Ambulatory Visit: Payer: Self-pay | Admitting: Orthopedic Surgery

## 2018-06-02 NOTE — H&P (Signed)
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 Unfortunately, he had some medical issues which required emergent abdominal surgery and the patient to go off of plavix, his cardiologist does not want him to go off of plavix again until June, therefore he has not gotten the repeat injection. Patient continues to complain of severe debilitating neck pain and radicular left arm pain without significant change in symptoms. Fortunately he denies any weakness. He has expressed that since the first injection was not helpful he does not necessarily want to get a second injection in the summer, but would rather move forward with surgery at that time. Currently he is managing his pain with Tylenol, hydrocodone as prescribed at the [place] [facility], and topical creams; he cannot take anti-inflammatories due to being on Plavix. Patient is scheduled with PT this morning to be fitted for his Aspen collar. He is scheduled for an ACDF C5-6 on 07/01/2018 with Dr. Rolena Infante.  Patient Active Problem List   Diagnosis Date Noted  . AKI (acute kidney injury) ([place])   . Carcinoid tumor of ileum 01/15/2018  . CAD (coronary artery disease) of artery bypass graft 01/07/2018  . Small bowel obstruction ([place]) 01/06/2018   Past Medical History:  Diagnosis Date  . Diabetes mellitus without complication ([place])   . Hyperlipidemia   . Hypertension     Past Surgical History:  Procedure Laterality Date  . ABDOMINAL SURGERY    . LAPAROSCOPIC PARTIAL COLECTOMY N/A 01/12/2018   Procedure: LAPAROSCOPIC ASSISTED PARTIAL COLECTOMY;  Surgeon: Erroll Luna, MD;  Location: [facility];  Service: General;  Laterality: N/A;    Current Outpatient Medications  Medication Sig Dispense Refill Last Dose  . aspirin EC 81 MG tablet Take 81 mg by mouth daily.   Taking  . carvedilol  (COREG) 6.25 MG tablet Take 3.12 mg by mouth 2 (two) times daily with a meal.   Taking  . clopidogrel (PLAVIX) 75 MG tablet Take 75 mg by mouth daily.   Taking  . Cyanocobalamin (VITAMIN B 12 PO) Take 2 tablets by mouth at bedtime.   Taking  . DULoxetine (CYMBALTA) 30 MG capsule Take 30 mg by mouth daily.   Taking  . HYDROcodone-acetaminophen (NORCO/VICODIN) 5-325 MG tablet Take 1 tablet by mouth as needed.   Taking  . metFORMIN (GLUCOPHAGE-XR) 500 MG 24 hr tablet Take 500 mg by mouth at bedtime.   Taking  . Multiple Vitamins-Minerals (MULTIVITAMIN PO) Take 1 tablet by mouth 2 (two) times daily.   Taking  . Omega-3 Fatty Acids (FISH OIL) 1000 MG CAPS Take 1,000 mg by mouth 2 (two) times daily.   Taking  . pantoprazole (PROTONIX) 40 MG tablet Take 1 tablet (40 mg total) by mouth every 12 (twelve) hours. 60 tablet 0 Taking  . pravastatin (PRAVACHOL) 40 MG tablet Take 40 mg by mouth daily.   Taking  . Probiotic Product (FLORAJEN BIFIDOBLEND) CAPS Take 1 capsule by mouth daily.   Taking  . tamsulosin (FLOMAX) 0.4 MG CAPS capsule Take 0.4 mg by mouth daily.   Taking  . traZODone (DESYREL) 100 MG tablet Take 100 mg by mouth at bedtime.   Taking   No current facility-administered medications for this visit.    Allergies  Allergen Reactions  . Latex Itching    Blisters skin    Social History   Tobacco  Use  . Smoking status: Never Smoker  . Smokeless tobacco: Never Used  Substance Use Topics  . Alcohol use: Not on file    Family History  Problem Relation Age of Onset  . Peripheral Artery Disease Father     Review of Systems As stated in HPI  Objective:   Clinical exam: Patient is alert and oriented 3. No shortness of breath or chest pain. CV: RRR, no rubs, murmers, gallops Pulm: CTAB Abdomen: Soft and nontender. No loss of bowel and bladder control, no rebound tenderness Neck: Significant neck pain radiating into the left upper extremity. Pain and loss in range of motion of the  cervical spine. Neuro: 5/5 motor strength in the upper external he bilaterally. Positive left Spurling sign with reproduction of C6 radicular pain. Positive loss in sensation light touch in the C6 dermatome along with dysesthesias in the C6 dermatome. 1+ deep tendon reflexes symmetrical in the upper extremity Reflexes: Hoffman: Negative Babinski: Negative Gait: Normal Musculoskeletal: No significant pain with range of motion of the shoulder, elbow, wrist.  MRI: Cervical MRI: completed on 05/26/18 was reviewed with the patient. I have also reviewed the radiology report. No change in his significant left foraminal stenosis with compression of the exiting C6 nerve root. Moderate degenerative changes at the adjacent C4-5 level. No cord signal changes. No significant central canal stenosis is noted. No acute fracture seen.  Assessment:   Antavious is a very pleasant 73 year old him with significant neck and neuropathic left arm pain. 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  Despite self directed exercises, attempted formal physical therapy, and injection therapy he continues to have severe debilitating pain. As a result he would like to move forward with surgery.  1. Cervical radiculopathy M54.12: Radiculopathy, cervical region  2. Degeneration of cervical intervertebral disc M50.30: Other cervical disc degeneration, unspecified cervical region CERVICAL DISC DISEASE: CARE INSTRUCTIONS  Plan:   We have discussed in great detail the ACDF. I have also reviewed the risks and benefits and all of his questions were encouraged and addressed.  Risks and benefits of surgery were discussed with the patient. These include: Infection, bleeding, death, stroke, paralysis, ongoing or worse pain, need for additional surgery, nonunion, leak of spinal fluid, adjacent segment degeneration requiring additional  fusion surgery. Pseudoarthrosis (nonunion)requiring supplemental posterior fixation. Throat pain, swallowing difficulties, hoarseness or change in voice.  All of patients questions were invited and answered.  We will move forward with a C5-6 ACDF at [facility] on 07/01/2018  Note Dictated by Cleta Alberts PA-C, Patient seen in conjuntion with Dr. Rolena Infante.

## 2018-06-24 NOTE — Progress Notes (Signed)
[id number] - [place], [place] - [address] [place] [address] Phone: [phone] Fax: [phone]      Your procedure is scheduled on June 3  Report to [facility] Entrance "A" at [place].M., and check in at the Admitting office.  Call this number if you have problems the morning of surgery:  [phone]  Call [phone] if you have any questions prior to your surgery date Monday-Friday 8am-4pm    Remember:  Do not eat or drink after midnight.    Take these medicines the morning of surgery with A SIP OF WATER  amLODipine (NORVASC) carvedilol (COREG)  DULoxetine (CYMBALTA)  HYDROcodone-acetaminophen (NORCO/VICODIN) pantoprazole (PROTONIX)  pravastatin (PRAVACHOL) tamsulosin (FLOMAX)  Follow your surgeon's instructions on when to stop Plavix.  If no instructions were given by your surgeon then you will need to call the office to get those instructions.     7 days prior to surgery STOP taking any Aspirin (unless otherwise instructed by your surgeon), Aleve, Naproxen, Ibuprofen, Motrin, Advil, Goody's, BC's, all herbal medications, fish oil, and all vitamins.   WHAT DO I DO ABOUT MY DIABETES MEDICATION?   Marland Kitchen Do not take oral diabetes medicines (pills) the morning of surgery. metFORMIN (GLUCOPHAGE-XR)  How to Manage Your Diabetes Before and After Surgery  Why is it important to control my blood sugar before and after surgery? . Improving blood sugar levels before and after surgery helps healing and can limit problems. . A way of improving blood sugar control is eating a healthy diet by: o  Eating less sugar and carbohydrates o  Increasing activity/exercise o  Talking with your doctor about reaching your blood sugar goals . High blood sugars (greater than 180 mg/dL) can raise your risk of infections and slow your recovery, so you will need to focus on controlling your diabetes during the weeks before surgery. . Make sure that the  doctor who takes care of your diabetes knows about your planned surgery including the date and location.  How do I manage my blood sugar before surgery? . Check your blood sugar at least 4 times a day, starting 2 days before surgery, to make sure that the level is not too high or low. o Check your blood sugar the morning of your surgery when you wake up and every 2 hours until you get to the Short Stay unit. . If your blood sugar is less than 70 mg/dL, you will need to treat for low blood sugar: o Do not take insulin. o Treat a low blood sugar (less than 70 mg/dL) with  cup of clear juice (cranberry or apple), 4 glucose tablets, OR glucose gel. o Recheck blood sugar in 15 minutes after treatment (to make sure it is greater than 70 mg/dL). If your blood sugar is not greater than 70 mg/dL on recheck, call [phone] for further instructions. . Report your blood sugar to the short stay nurse when you get to Short Stay.  . If you are admitted to the hospital after surgery: o Your blood sugar will be checked by the staff and you will probably be given insulin after surgery (instead of oral diabetes medicines) to make sure you have good blood sugar levels. o The goal for blood sugar control after surgery is 80-180 mg/dL.    The Morning of Surgery  Do not wear jewelry, make-up or nail polish.  Do not wear lotions, powders, or perfumes/colognes, or deodorant  Do not shave 48  hours prior to surgery.  Men may shave face and neck.  Do not bring valuables to the hospital.  [facility] is not responsible for any belongings or valuables.  If you are a smoker, DO NOT Smoke 24 hours prior to surgery IF you wear a CPAP at night please bring your mask, tubing, and machine the morning of surgery   Remember that you must have someone to transport you home after your surgery, and remain with you for 24 hours if you are discharged the same day.   Contacts, glasses, hearing aids, dentures or bridgework may  not be worn into surgery.    Leave your suitcase in the car.  After surgery it may be brought to your room.  For patients admitted to the hospital, discharge time will be determined by your treatment team.  Patients discharged the day of surgery will not be allowed to drive home.    Special instructions:   [facility]- Preparing For Surgery  Before surgery, you can play an important role. Because skin is not sterile, your skin needs to be as free of germs as possible. You can reduce the number of germs on your skin by washing with CHG (chlorahexidine gluconate) Soap before surgery.  CHG is an antiseptic cleaner which kills germs and bonds with the skin to continue killing germs even after washing.    Oral Hygiene is also important to reduce your risk of infection.  Remember - BRUSH YOUR TEETH THE MORNING OF SURGERY WITH YOUR REGULAR TOOTHPASTE  Please do not use if you have an allergy to CHG or antibacterial soaps. If your skin becomes reddened/irritated stop using the CHG.  Do not shave (including legs and underarms) for at least 48 hours prior to first CHG shower. It is OK to shave your face.  Please follow these instructions carefully.   1. Shower the NIGHT BEFORE SURGERY and the MORNING OF SURGERY with CHG Soap.   2. If you chose to wash your hair, wash your hair first as usual with your normal shampoo.  3. After you shampoo, rinse your hair and body thoroughly to remove the shampoo.  4. Use CHG as you would any other liquid soap. You can apply CHG directly to the skin and wash gently with a scrungie or a clean washcloth.   5. Apply the CHG Soap to your body ONLY FROM THE NECK DOWN.  Do not use on open wounds or open sores. Avoid contact with your eyes, ears, mouth and genitals (private parts). Wash Face and genitals (private parts)  with your normal soap.   6. Wash thoroughly, paying special attention to the area where your surgery will be performed.  7. Thoroughly rinse your  body with warm water from the neck down.  8. DO NOT shower/wash with your normal soap after using and rinsing off the CHG Soap.  9. Pat yourself dry with a CLEAN TOWEL.  10. Wear CLEAN PAJAMAS to bed the night before surgery, wear comfortable clothes the morning of surgery  11. Place CLEAN SHEETS on your bed the night of your first shower and DO NOT SLEEP WITH PETS.    Day of Surgery:  Do not apply any deodorants/lotions.  Please wear clean clothes to the hospital/surgery center.   Remember to brush your teeth WITH YOUR REGULAR TOOTHPASTE.   Please read over the following fact sheets that you were given.

## 2018-06-25 ENCOUNTER — Encounter (HOSPITAL_COMMUNITY)
Admission: RE | Admit: 2018-06-25 | Discharge: 2018-06-25 | Disposition: A | Discharge status: Home / Self Care | Payer: Medicare HMO | Source: Ambulatory Visit | Attending: Orthopedic Surgery | Admitting: Orthopedic Surgery

## 2018-06-25 ENCOUNTER — Encounter (HOSPITAL_COMMUNITY): Payer: Self-pay

## 2018-06-25 ENCOUNTER — Other Ambulatory Visit: Payer: Self-pay

## 2018-06-25 DIAGNOSIS — Z01812 Encounter for preprocedural laboratory examination: Secondary | ICD-10-CM | POA: Insufficient documentation

## 2018-06-25 HISTORY — DX: Anemia, unspecified: D64.9

## 2018-06-25 HISTORY — DX: Atherosclerotic heart disease of native coronary artery without angina pectoris: I25.10

## 2018-06-25 HISTORY — DX: Nausea with vomiting, unspecified: R11.2

## 2018-06-25 HISTORY — DX: Concussion with loss of consciousness of unspecified duration, initial encounter: S06.0X9A

## 2018-06-25 HISTORY — DX: Post-traumatic stress disorder, unspecified: F43.10

## 2018-06-25 HISTORY — DX: Concussion with loss of consciousness status unknown, initial encounter: S06.0XAA

## 2018-06-25 HISTORY — DX: Gastric ulcer, unspecified as acute or chronic, without hemorrhage or perforation: K25.9

## 2018-06-25 HISTORY — DX: Benign prostatic hyperplasia without lower urinary tract symptoms: N40.0

## 2018-06-25 HISTORY — DX: Type 2 diabetes mellitus with diabetic neuropathy, unspecified: E11.40

## 2018-06-25 HISTORY — DX: Unspecified intestinal obstruction, unspecified as to partial versus complete obstruction: K56.609

## 2018-06-25 HISTORY — DX: Malignant (primary) neoplasm, unspecified: C80.1

## 2018-06-25 HISTORY — DX: Personal history of urinary calculi: Z87.442

## 2018-06-25 HISTORY — DX: Radiculopathy, cervical region: M54.12

## 2018-06-25 LAB — CBC
HCT: 39.9 % (ref 39.0–52.0)
Hemoglobin: 12.6 g/dL — ABNORMAL LOW (ref 13.0–17.0)
MCH: 28.1 pg (ref 26.0–34.0)
MCHC: 31.6 g/dL (ref 30.0–36.0)
MCV: 88.9 fL (ref 80.0–100.0)
Platelets: 190 10*3/uL (ref 150–400)
RBC: 4.49 MIL/uL (ref 4.22–5.81)
RDW: 13.7 % (ref 11.5–15.5)
WBC: 5.5 10*3/uL (ref 4.0–10.5)
nRBC: 0 % (ref 0.0–0.2)

## 2018-06-25 LAB — URINALYSIS, ROUTINE W REFLEX MICROSCOPIC
Bilirubin Urine: NEGATIVE
Glucose, UA: NEGATIVE mg/dL
Hgb urine dipstick: NEGATIVE
Ketones, ur: NEGATIVE mg/dL
Leukocytes,Ua: NEGATIVE
Nitrite: NEGATIVE
Protein, ur: NEGATIVE mg/dL
Specific Gravity, Urine: 1.017 (ref 1.005–1.030)
pH: 5 (ref 5.0–8.0)

## 2018-06-25 LAB — BASIC METABOLIC PANEL
Anion gap: 8 (ref 5–15)
BUN: 19 mg/dL (ref 8–23)
CO2: 29 mmol/L (ref 22–32)
Calcium: 9.4 mg/dL (ref 8.9–10.3)
Chloride: 107 mmol/L (ref 98–111)
Creatinine, Ser: 1.14 mg/dL (ref 0.61–1.24)
GFR calc Af Amer: >60 mL/min (ref >60)
GFR calc non Af Amer: >60 mL/min (ref >60)
Glucose, Bld: 141 mg/dL — ABNORMAL HIGH (ref 70–99)
Potassium: 4.1 mmol/L (ref 3.5–5.1)
Sodium: 144 mmol/L (ref 135–145)

## 2018-06-25 LAB — PROTIME-INR
INR: 1.1 (ref 0.8–1.2)
Prothrombin Time: 14 seconds (ref 11.4–15.2)

## 2018-06-25 LAB — GLUCOSE, CAPILLARY: Glucose-Capillary: 128 mg/dL — ABNORMAL HIGH (ref 70–99)

## 2018-06-25 LAB — SURGICAL PCR SCREEN
MRSA, PCR: NEGATIVE
Staphylococcus aureus: NEGATIVE

## 2018-06-25 NOTE — Progress Notes (Signed)
Pt denies SOB and chest pain.  Pt stated that he is under the care of Dr. Verdell Face, Cardiology at [facility]. Pt stated that PCP is Dr. Jefm Petty at [facility].  Pt stated that a chest x ray was recently performed at the [facility] in [place], [place] and an EKG was performed at [facility] by PCP; nurse requested records including LOV note; awaiting response. Pt stated that he stopped taking both Aspirin and Plavix " on my own."  Pt stated that last dose of both was " Monday."  Pt instructed to call surgeon's office to clarify pre-op Aspirin and Plavix instructions.  Nurse called and lvm with Ellie Lunch, Surgical Coordinator, to make MD aware that pt stopped taking Aspirin and Plavix  Pt denies that he and family members tested positive for COVID-19.  Pt denies that he and family members experienced the following symptoms:  Cough yes/no: No Fever (>100.67F)  yes/no: No Runny nose yes/no: No Sore throat yes/no: No Difficulty breathing/shortness of breath  yes/no: No  Have you or a family member traveled in the last 14 days and where? yes/no: No  Pt reminded that hospital visitation restrictions are in effect and the importance of the restrictions.   Pt chart forwarded to PA, Anesthesiology, to review cardiac history.

## 2018-06-26 NOTE — Progress Notes (Addendum)
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  Patient has been having cervical radiculopathy symptoms for "some time now", but surgical treatment timing complicated by 06/9676 DES and then 12/2017 SBO requiring surgery. Injections not helping and wanted to proceed with surgery. His cardiologist Dr. Gerarda Gunther had recommended waiting until June 2020 to undergo neck surgery. Patient said ASA and Plavix were held starting 06/22/18. Patient last seen by his PCP Dr. Jeralene Huff on 06/16/18 for preoperative labs/EKG. H/H 12.4/36.9, MCV 86.9. He thought "it might be a good idea to figure out why" patient was mildly anemia before surgery but patient declined. He did agree to iron studies which showed his ferritin was low at 12. He is also currently being followed by [facility] for his carcinoid tumor.   Reviewed above with anesthesiologist Nolon Nations, MD. Given patient is off DAPT prior to his one year mark post 07/03/17, he recommended at least notifying Dr. Gerarda Gunther in case in additional recommendations. I spoke with [place] at Dr. Alfredo Batty office and she will relay the message. Return response requested. Chart will be left for follow-up. (ADDENDUM 06/29/18 9:54 AM: Staff from Dr. Alfredo Batty office reported that he did not have any further presurgical recommendations following  review of DAPT hold time and upcoming surgery.  He is getting his presurgical COVID test today. Based on currently available information, I would anticipate that he can proceed as planned.)   VS: BP 137/82   Pulse 86   Temp 36.6 C   Resp 20   Ht 5\' 10"  (1.778 m)   Wt 85.8 kg   SpO2 99%   BMI 27.15 kg/m    PROVIDERS: Jefm Petty, MD is PCP [facility]). Verdell Face, MD is primary cardiologist ([place]). Last visit 02/23/18.  He was seen by [facility] cardiologist Lauree Chandler, MD on 01/06/18 when patient presented with SBO and need for surgery. At that time, holding Plavix for 5 days was recommended.  Betsy Coder, MD is [facility]. Observation recommended for carcinoid tumor of the terminal ileum, s/p resection.    LABS: Labs reviewed: Acceptable for surgery. A1c 6.6% on 06/16/18 ([facility]). (all labs ordered are listed, but only abnormal results are displayed)  Labs Reviewed  GLUCOSE, CAPILLARY - Abnormal; Notable for the following components:      Result Value   Glucose-Capillary 128 (*)    All other components within normal limits  BASIC METABOLIC PANEL - Abnormal; Notable for the following components:   Glucose, Bld 141 (*)    All other components within normal limits  CBC - Abnormal; Notable for the following components:   Hemoglobin 12.6 (*)    All other components within normal limits  SURGICAL PCR SCREEN  PROTIME-INR  URINALYSIS, ROUTINE W REFLEX MICROSCOPIC    IMAGES: CXR: Requested from [place], otherwise will need day of surgery per surgeon orders.  EKG:  - EKG 06/26/18 [facility]): Tracing copy on hard chart. Ventricular Rate          67    BPM          Atrial Rate            67    BPM          P-R Interval            166    ms          QRS Duration            88    ms          Q-T Interval            376    ms           QTC                397    ms          P Axis               46    degrees        R Axis               0     degrees        T Axis               16    degrees        Sinus rhythm Septal infarct , age undetermined Inferior infarct , age undetermined When compared with ECG of 05-May-2015 19:44, Septal infarct now present Inferior infarct now present Nonspecific T wave abnormality no longer evident in lateral leads Confirmed by [facility], DR. Mamie Nick. M. ([id number]) on 06/26/2018 10:32:23 AM  (Low or loss or r wave in V2 present when compared to 01/18/18 tracing, but more similar to 01/06/18 tracing in [facility].)  - EKG 01/18/18: Normal sinus rhythm Nonspecific T wave abnormality Abnormal ECG No significant change since last tracing Confirmed by Kirk Ruths [phone]) on 01/18/2018 5:53:08 PM   CV: Cardiac cath 07/04/18 ([facility]; done for small reversible inferior defect on 05/23/17 stress test): Coronary anatomy: Right dominant - Left main: Left main medium size and normal - Left anterior descending: Left anterior descending artery medium caliber vessel and appears to be normal - Left circumflex: Medium caliber vessel which gives rise to a few obtuse marginal branches but appears to be normal. However first obtuse marginal is a medium caliber vessel which appears to have 70% lesion in the proximal section of the artery and then  there is a 50-60% lesion noted right upper that - Right coronary artery: Large and dominant artery but appears to have about 50% lesion in the proximal section of the artery. - Bypass grafts: None - Left ventriculogram: LV gram performed which appears to be normal systolic function - Hemodynamics : Aortic pressure -137/67 , LV pressure -130 , LVEDP -15 . Impression: 1. Multivessel native coronary artery disease with critical first obtuse marginal lesion. The large RCA has 50%  lesion in the proximal section of the artery 2. Normal LV function PCI: Successful PTCA/stent placement of the ramus intermedius, 2.5 x 24 Synergy drug-eluting stent.  Echo 05/24/27 ([facility]): Interpretation Summary A complete two-dimensional transthoracic echocardiogram was performed (2D, M-mode, Doppler and color flow Doppler). The study was technically adequate. There is no comparison study available. Left ventricular systolic function is normal. Ejection Fraction = >  55%. There is mild mitral regurgitation. There is mild tricuspid regurgitation.  Carotid US 05/23/17 ([facility]): Interpretation Summary No evidence of focal increase in ICA velocities/stenosis or occlusion bilaterally. No hemodynamically significant ICA stenosis bilaterally, < 50%. Antegrade flow in the vertebral arteries bilaterally.   Past Medical History:  Diagnosis Date  . Anemia   . Cancer [facility])    carcinoid tumor of the ileum  . Cervical radiculopathy    and degeneration of cervical intervertebral  disc  . Concussion    in [place]  . Coronary artery disease   . Diabetes mellitus without complication ([place])   . Diabetic neuropathy ([place])   . Enlarged prostate   . History of kidney stones   . Hyperlipidemia   . Hypertension   . PONV (postoperative nausea and vomiting)   . PTSD (post-traumatic stress disorder)   . SBO (small bowel obstruction) ([place])   . Stomach ulcer     Past Surgical History:  Procedure Laterality Date  . ABDOMINAL SURGERY    . BACK SURGERY    . BILATERAL CARPAL TUNNEL RELEASE    . CARDIAC CATHETERIZATION    . CORONARY STENT PLACEMENT    . JOINT REPLACEMENT     B/L hips  . LAPAROSCOPIC PARTIAL COLECTOMY N/A 01/12/2018   Procedure: LAPAROSCOPIC ASSISTED PARTIAL COLECTOMY;  Surgeon: Erroll Luna, MD;  Location: [place];  Service: General;  Laterality: N/A;  . MENISCUS REPAIR     left  . rotator cuff tear     left  . TONSILLECTOMY      MEDICATIONS: . amLODipine (NORVASC)  5 MG tablet  . aspirin EC 81 MG tablet  . carvedilol (COREG) 12.5 MG tablet  . clopidogrel (PLAVIX) 75 MG tablet  . Cyanocobalamin (VITAMIN B 12) 500 MCG TABS  . DULoxetine (CYMBALTA) 30 MG capsule  . HYDROcodone-acetaminophen (NORCO/VICODIN) 5-325 MG tablet  . lisinopril (ZESTRIL) 10 MG tablet  . metFORMIN (GLUCOPHAGE-XR) 500 MG 24 hr tablet  . Multiple Vitamins-Minerals (MULTIVITAMIN PO)  . Multiple Vitamins-Minerals (PRESERVISION AREDS 2 PO)  . Omega-3 Fatty Acids (FISH OIL) 1000 MG CAPS  . pantoprazole (PROTONIX) 40 MG tablet  . pravastatin (PRAVACHOL) 40 MG tablet  . Probiotic Product (FLORAJEN BIFIDOBLEND) CAPS  . tamsulosin (FLOMAX) 0.4 MG CAPS capsule  . traZODone (DESYREL) 150 MG tablet   No current facility-administered medications for this encounter.     Myra Gianotti, PA-C Surgical Short Stay/Anesthesiology [facility] Phone [phone] [facility] Phone [phone] 06/26/2018 2:33 PM

## 2018-06-29 ENCOUNTER — Other Ambulatory Visit (HOSPITAL_COMMUNITY)
Admission: RE | Admit: 2018-06-29 | Discharge: 2018-06-29 | Disposition: A | Discharge status: Home / Self Care | Payer: Medicare HMO | Source: Ambulatory Visit | Attending: Orthopedic Surgery | Admitting: Orthopedic Surgery

## 2018-06-29 ENCOUNTER — Other Ambulatory Visit: Payer: Self-pay

## 2018-06-29 DIAGNOSIS — Z1159 Encounter for screening for other viral diseases: Secondary | ICD-10-CM | POA: Insufficient documentation

## 2018-06-29 LAB — SARS CORONAVIRUS 2 BY RT PCR (HOSPITAL ORDER, PERFORMED IN ~~LOC~~ HOSPITAL LAB): SARS Coronavirus 2: NEGATIVE

## 2018-06-30 NOTE — Anesthesia Preprocedure Evaluation (Addendum)
Anesthesia Evaluation  Patient identified by MRN, date of birth, ID band Patient awake    Reviewed: Allergy & Precautions, NPO status , Patient's Chart, lab work & pertinent test results  History of Anesthesia Complications (+) PONVNegative for: history of anesthetic complications  Airway Mallampati: II  TM Distance: >3 FB Neck ROM: Full    Dental no notable dental hx. (+) Teeth Intact, Dental Advisory Given,    Pulmonary neg pulmonary ROS, former smoker,    Pulmonary exam normal breath sounds clear to auscultation       Cardiovascular Exercise Tolerance: Good hypertension, + CAD and + Cardiac Stents (07/03/17)  Normal cardiovascular exam Rhythm:Regular Rate:Normal     Neuro/Psych PSYCHIATRIC DISORDERS Anxiety negative neurological ROS     GI/Hepatic Neg liver ROS, PUD,   Endo/Other  diabetes, Type 2, Oral Hypoglycemic AgentsCBG = 107  Renal/GU negative Renal ROS  negative genitourinary   Musculoskeletal negative musculoskeletal ROS (+)   Abdominal   Peds  Hematology  (+) anemia ,   Anesthesia Other Findings   Reproductive/Obstetrics                           Anesthesia Physical Anesthesia Plan  ASA: III  Anesthesia Plan: General   Post-op Pain Management:    Induction: Intravenous  PONV Risk Score and Plan: 3 and Ondansetron, Dexamethasone, Midazolam and Treatment may vary due to age or medical condition  Airway Management Planned: Oral ETT and Video Laryngoscope Planned  Additional Equipment: None  Intra-op Plan:   Post-operative Plan: Extubation in OR  Informed Consent: I have reviewed the patients History and Physical, chart, labs and discussed the procedure including the risks, benefits and alternatives for the proposed anesthesia with the patient or authorized representative who has indicated his/her understanding and acceptance.     Dental advisory given  Plan  Discussed with: CRNA and Anesthesiologist  Anesthesia Plan Comments:       Anesthesia Quick Evaluation

## 2018-07-01 ENCOUNTER — Ambulatory Visit (HOSPITAL_COMMUNITY)
Admission: RE | Admit: 2018-07-01 | Discharge: 2018-07-02 | Disposition: A | Discharge status: Home / Self Care | Payer: Medicare HMO | Attending: Orthopedic Surgery | Admitting: Orthopedic Surgery

## 2018-07-01 ENCOUNTER — Ambulatory Visit (HOSPITAL_COMMUNITY): Payer: Medicare HMO | Admitting: Anesthesiology

## 2018-07-01 ENCOUNTER — Ambulatory Visit (HOSPITAL_COMMUNITY)
Admission: RE | Disposition: A | Discharge status: Home / Self Care | Payer: Self-pay | Source: Home / Self Care | Attending: Orthopedic Surgery

## 2018-07-01 ENCOUNTER — Other Ambulatory Visit: Payer: Self-pay

## 2018-07-01 ENCOUNTER — Ambulatory Visit (HOSPITAL_COMMUNITY): Payer: Medicare HMO

## 2018-07-01 ENCOUNTER — Encounter (HOSPITAL_COMMUNITY): Payer: Self-pay

## 2018-07-01 ENCOUNTER — Ambulatory Visit (HOSPITAL_COMMUNITY): Payer: Medicare HMO | Admitting: Vascular Surgery

## 2018-07-01 DIAGNOSIS — M5412 Radiculopathy, cervical region: Secondary | ICD-10-CM | POA: Insufficient documentation

## 2018-07-01 DIAGNOSIS — M2578 Osteophyte, vertebrae: Secondary | ICD-10-CM | POA: Insufficient documentation

## 2018-07-01 DIAGNOSIS — Z955 Presence of coronary angioplasty implant and graft: Secondary | ICD-10-CM | POA: Diagnosis not present

## 2018-07-01 DIAGNOSIS — E119 Type 2 diabetes mellitus without complications: Secondary | ICD-10-CM | POA: Insufficient documentation

## 2018-07-01 DIAGNOSIS — I10 Essential (primary) hypertension: Secondary | ICD-10-CM | POA: Diagnosis not present

## 2018-07-01 DIAGNOSIS — Z79899 Other long term (current) drug therapy: Secondary | ICD-10-CM | POA: Insufficient documentation

## 2018-07-01 DIAGNOSIS — M5020 Other cervical disc displacement, unspecified cervical region: Secondary | ICD-10-CM | POA: Diagnosis present

## 2018-07-01 DIAGNOSIS — I2510 Atherosclerotic heart disease of native coronary artery without angina pectoris: Secondary | ICD-10-CM | POA: Diagnosis not present

## 2018-07-01 DIAGNOSIS — D649 Anemia, unspecified: Secondary | ICD-10-CM | POA: Insufficient documentation

## 2018-07-01 DIAGNOSIS — F419 Anxiety disorder, unspecified: Secondary | ICD-10-CM | POA: Insufficient documentation

## 2018-07-01 DIAGNOSIS — Z7901 Long term (current) use of anticoagulants: Secondary | ICD-10-CM | POA: Insufficient documentation

## 2018-07-01 DIAGNOSIS — Z7984 Long term (current) use of oral hypoglycemic drugs: Secondary | ICD-10-CM | POA: Insufficient documentation

## 2018-07-01 DIAGNOSIS — Z419 Encounter for procedure for purposes other than remedying health state, unspecified: Secondary | ICD-10-CM

## 2018-07-01 DIAGNOSIS — Z87891 Personal history of nicotine dependence: Secondary | ICD-10-CM | POA: Insufficient documentation

## 2018-07-01 DIAGNOSIS — K279 Peptic ulcer, site unspecified, unspecified as acute or chronic, without hemorrhage or perforation: Secondary | ICD-10-CM | POA: Diagnosis not present

## 2018-07-01 DIAGNOSIS — Z01818 Encounter for other preprocedural examination: Secondary | ICD-10-CM

## 2018-07-01 HISTORY — PX: ANTERIOR CERVICAL DECOMP/DISCECTOMY FUSION: SHX1161

## 2018-07-01 LAB — GLUCOSE, CAPILLARY
Glucose-Capillary: 107 mg/dL — ABNORMAL HIGH (ref 70–99)
Glucose-Capillary: 136 mg/dL — ABNORMAL HIGH (ref 70–99)
Glucose-Capillary: 157 mg/dL — ABNORMAL HIGH (ref 70–99)
Glucose-Capillary: 168 mg/dL — ABNORMAL HIGH (ref 70–99)

## 2018-07-01 SURGERY — ANTERIOR CERVICAL DECOMPRESSION/DISCECTOMY FUSION 1 LEVEL
Anesthesia: General | Site: Neck

## 2018-07-01 MED ORDER — ROCURONIUM BROMIDE 10 MG/ML (PF) SYRINGE
PREFILLED_SYRINGE | INTRAVENOUS | Status: DC | PRN
  Administered 2018-07-01: 50 mg via INTRAVENOUS
  Administered 2018-07-01: 20 mg via INTRAVENOUS

## 2018-07-01 MED ORDER — CEFAZOLIN SODIUM-DEXTROSE 2-4 GM/100ML-% IV SOLN
INTRAVENOUS | Status: AC
  Filled 2018-07-01: qty 100

## 2018-07-01 MED ORDER — ONDANSETRON HCL 4 MG/2ML IJ SOLN: 4.0000 mg | Freq: Once | INTRAMUSCULAR | Status: DC | PRN

## 2018-07-01 MED ORDER — AMLODIPINE BESYLATE 5 MG PO TABS
5.0000 mg | ORAL_TABLET | Freq: Every day | ORAL | Status: DC
  Administered 2018-07-01 – 2018-07-02 (×2): 5 mg via ORAL
  Filled 2018-07-01 (×2): qty 1

## 2018-07-01 MED ORDER — METHOCARBAMOL 500 MG PO TABS: 500.0000 mg | ORAL_TABLET | Freq: Three times a day (TID) | ORAL | 0 refills | Status: AC | PRN

## 2018-07-01 MED ORDER — DEXAMETHASONE SODIUM PHOSPHATE 10 MG/ML IJ SOLN
INTRAMUSCULAR | Status: DC | PRN
  Administered 2018-07-01: 10 mg via INTRAVENOUS

## 2018-07-01 MED ORDER — LISINOPRIL 10 MG PO TABS
10.0000 mg | ORAL_TABLET | Freq: Every day | ORAL | Status: DC
  Administered 2018-07-01 – 2018-07-02 (×2): 10 mg via ORAL
  Filled 2018-07-01 (×2): qty 1

## 2018-07-01 MED ORDER — SUCCINYLCHOLINE CHLORIDE 200 MG/10ML IV SOSY
PREFILLED_SYRINGE | INTRAVENOUS | Status: DC | PRN
  Administered 2018-07-01: 100 mg via INTRAVENOUS

## 2018-07-01 MED ORDER — PHENYLEPHRINE 40 MCG/ML (10ML) SYRINGE FOR IV PUSH (FOR BLOOD PRESSURE SUPPORT)
PREFILLED_SYRINGE | INTRAVENOUS | Status: DC | PRN
  Administered 2018-07-01 (×4): 80 ug via INTRAVENOUS

## 2018-07-01 MED ORDER — DEXAMETHASONE SODIUM PHOSPHATE 10 MG/ML IJ SOLN
INTRAMUSCULAR | Status: AC
  Filled 2018-07-01: qty 1

## 2018-07-01 MED ORDER — ACETAMINOPHEN 650 MG RE SUPP: 650.0000 mg | RECTAL | Status: DC | PRN

## 2018-07-01 MED ORDER — PROPOFOL 10 MG/ML IV BOLUS
INTRAVENOUS | Status: DC | PRN
  Administered 2018-07-01: 150 mg via INTRAVENOUS
  Administered 2018-07-01: 30 mg via INTRAVENOUS

## 2018-07-01 MED ORDER — ONDANSETRON HCL 4 MG PO TABS: 4.0000 mg | ORAL_TABLET | Freq: Three times a day (TID) | ORAL | 0 refills | Status: DC | PRN

## 2018-07-01 MED ORDER — FENTANYL CITRATE (PF) 100 MCG/2ML IJ SOLN
INTRAMUSCULAR | Status: AC
  Filled 2018-07-01: qty 2

## 2018-07-01 MED ORDER — METHOCARBAMOL 500 MG PO TABS
500.0000 mg | ORAL_TABLET | Freq: Four times a day (QID) | ORAL | Status: DC | PRN
  Administered 2018-07-01 (×2): 500 mg via ORAL
  Filled 2018-07-01 (×3): qty 1

## 2018-07-01 MED ORDER — PRAVASTATIN SODIUM 40 MG PO TABS
40.0000 mg | ORAL_TABLET | Freq: Every day | ORAL | Status: DC
  Administered 2018-07-01: 40 mg via ORAL
  Filled 2018-07-01: qty 1

## 2018-07-01 MED ORDER — ONDANSETRON HCL 4 MG PO TABS: 4.0000 mg | ORAL_TABLET | Freq: Four times a day (QID) | ORAL | Status: DC | PRN

## 2018-07-01 MED ORDER — PHENOL 1.4 % MT LIQD: 1.0000 | OROMUCOSAL | Status: DC | PRN

## 2018-07-01 MED ORDER — TRANEXAMIC ACID-NACL 1000-0.7 MG/100ML-% IV SOLN
INTRAVENOUS | Status: AC
  Filled 2018-07-01: qty 100

## 2018-07-01 MED ORDER — METFORMIN HCL ER 500 MG PO TB24
500.0000 mg | ORAL_TABLET | Freq: Every day | ORAL | Status: DC
  Administered 2018-07-01: 500 mg via ORAL
  Filled 2018-07-01: qty 1

## 2018-07-01 MED ORDER — BUPIVACAINE-EPINEPHRINE 0.25% -1:200000 IJ SOLN
INTRAMUSCULAR | Status: DC | PRN
  Administered 2018-07-01: 8 mL

## 2018-07-01 MED ORDER — DULOXETINE HCL 30 MG PO CPEP
30.0000 mg | ORAL_CAPSULE | Freq: Every day | ORAL | Status: DC
  Administered 2018-07-02: 30 mg via ORAL
  Filled 2018-07-01: qty 1

## 2018-07-01 MED ORDER — OXYCODONE HCL 5 MG/5ML PO SOLN: 5.0000 mg | Freq: Once | ORAL | Status: AC | PRN

## 2018-07-01 MED ORDER — METHOCARBAMOL 500 MG PO TABS
ORAL_TABLET | ORAL | Status: AC
  Filled 2018-07-01: qty 1

## 2018-07-01 MED ORDER — LACTATED RINGERS IV SOLN
INTRAVENOUS | Status: DC
  Administered 2018-07-01: 15:00:00 via INTRAVENOUS

## 2018-07-01 MED ORDER — ACETAMINOPHEN 10 MG/ML IV SOLN
1000.0000 mg | Freq: Once | INTRAVENOUS | Status: AC
  Administered 2018-07-01: 1000 mg via INTRAVENOUS
  Filled 2018-07-01: qty 100

## 2018-07-01 MED ORDER — SUGAMMADEX SODIUM 200 MG/2ML IV SOLN
INTRAVENOUS | Status: DC | PRN
  Administered 2018-07-01: 175 mg via INTRAVENOUS

## 2018-07-01 MED ORDER — FENTANYL CITRATE (PF) 100 MCG/2ML IJ SOLN
INTRAMUSCULAR | Status: DC | PRN
  Administered 2018-07-01: 100 ug via INTRAVENOUS
  Administered 2018-07-01: 50 ug via INTRAVENOUS

## 2018-07-01 MED ORDER — LACTATED RINGERS IV SOLN
INTRAVENOUS | Status: DC | PRN
  Administered 2018-07-01: 08:00:00 via INTRAVENOUS

## 2018-07-01 MED ORDER — LIDOCAINE 2% (20 MG/ML) 5 ML SYRINGE
INTRAMUSCULAR | Status: DC | PRN
  Administered 2018-07-01: 60 mg via INTRAVENOUS

## 2018-07-01 MED ORDER — SODIUM CHLORIDE 0.9% FLUSH
3.0000 mL | Freq: Two times a day (BID) | INTRAVENOUS | Status: DC
  Administered 2018-07-01 – 2018-07-02 (×2): 3 mL via INTRAVENOUS

## 2018-07-01 MED ORDER — FENTANYL CITRATE (PF) 100 MCG/2ML IJ SOLN
25.0000 ug | INTRAMUSCULAR | Status: DC | PRN
  Administered 2018-07-01 (×3): 50 ug via INTRAVENOUS

## 2018-07-01 MED ORDER — METHOCARBAMOL 1000 MG/10ML IJ SOLN
500.0000 mg | Freq: Four times a day (QID) | INTRAVENOUS | Status: DC | PRN
  Administered 2018-07-02: 500 mg via INTRAVENOUS
  Filled 2018-07-01: qty 5

## 2018-07-01 MED ORDER — FENTANYL CITRATE (PF) 250 MCG/5ML IJ SOLN
INTRAMUSCULAR | Status: AC
  Filled 2018-07-01: qty 5

## 2018-07-01 MED ORDER — MENTHOL 3 MG MT LOZG: 1.0000 | LOZENGE | OROMUCOSAL | Status: DC | PRN

## 2018-07-01 MED ORDER — CEFAZOLIN SODIUM-DEXTROSE 2-4 GM/100ML-% IV SOLN
2.0000 g | INTRAVENOUS | Status: AC
  Administered 2018-07-01: 09:00:00 2 g via INTRAVENOUS

## 2018-07-01 MED ORDER — PROPOFOL 10 MG/ML IV BOLUS
INTRAVENOUS | Status: AC
  Filled 2018-07-01: qty 20

## 2018-07-01 MED ORDER — 0.9 % SODIUM CHLORIDE (POUR BTL) OPTIME
TOPICAL | Status: DC | PRN
  Administered 2018-07-01: 1000 mL

## 2018-07-01 MED ORDER — THROMBIN 20000 UNITS EX SOLR
CUTANEOUS | Status: DC | PRN
  Administered 2018-07-01: 09:00:00 via TOPICAL

## 2018-07-01 MED ORDER — CARVEDILOL 6.25 MG PO TABS
6.2500 mg | ORAL_TABLET | Freq: Two times a day (BID) | ORAL | Status: DC
  Administered 2018-07-01 – 2018-07-02 (×2): 6.25 mg via ORAL
  Filled 2018-07-01 (×2): qty 1

## 2018-07-01 MED ORDER — MIDAZOLAM HCL 2 MG/2ML IJ SOLN
INTRAMUSCULAR | Status: AC
  Filled 2018-07-01: qty 2

## 2018-07-01 MED ORDER — CEFAZOLIN SODIUM-DEXTROSE 1-4 GM/50ML-% IV SOLN
1.0000 g | Freq: Three times a day (TID) | INTRAVENOUS | Status: AC
  Administered 2018-07-01 – 2018-07-02 (×2): 1 g via INTRAVENOUS
  Filled 2018-07-01 (×2): qty 50

## 2018-07-01 MED ORDER — ROCURONIUM BROMIDE 10 MG/ML (PF) SYRINGE
PREFILLED_SYRINGE | INTRAVENOUS | Status: AC
  Filled 2018-07-01: qty 10

## 2018-07-01 MED ORDER — ACETAMINOPHEN 325 MG PO TABS
650.0000 mg | ORAL_TABLET | ORAL | Status: DC | PRN
  Administered 2018-07-02: 650 mg via ORAL
  Filled 2018-07-01: qty 2

## 2018-07-01 MED ORDER — TRANEXAMIC ACID-NACL 1000-0.7 MG/100ML-% IV SOLN
1000.0000 mg | INTRAVENOUS | Status: AC
  Administered 2018-07-01: 10:00:00 1000 mg via INTRAVENOUS

## 2018-07-01 MED ORDER — THROMBIN (RECOMBINANT) 20000 UNITS EX SOLR
CUTANEOUS | Status: AC
  Filled 2018-07-01: qty 20000

## 2018-07-01 MED ORDER — ONDANSETRON HCL 4 MG/2ML IJ SOLN
4.0000 mg | Freq: Four times a day (QID) | INTRAMUSCULAR | Status: DC | PRN
  Administered 2018-07-02: 4 mg via INTRAVENOUS
  Filled 2018-07-01: qty 2

## 2018-07-01 MED ORDER — OXYCODONE HCL 5 MG PO TABS
ORAL_TABLET | ORAL | Status: AC
  Filled 2018-07-01: qty 1

## 2018-07-01 MED ORDER — INSULIN ASPART 100 UNIT/ML ~~LOC~~ SOLN
0.0000 [IU] | Freq: Three times a day (TID) | SUBCUTANEOUS | Status: DC
  Administered 2018-07-01: 3 [IU] via SUBCUTANEOUS
  Administered 2018-07-02: 2 [IU] via SUBCUTANEOUS

## 2018-07-01 MED ORDER — PHENYLEPHRINE 40 MCG/ML (10ML) SYRINGE FOR IV PUSH (FOR BLOOD PRESSURE SUPPORT)
PREFILLED_SYRINGE | INTRAVENOUS | Status: AC
  Filled 2018-07-01: qty 10

## 2018-07-01 MED ORDER — ACETAMINOPHEN 10 MG/ML IV SOLN
INTRAVENOUS | Status: AC
  Filled 2018-07-01: qty 100

## 2018-07-01 MED ORDER — ONDANSETRON HCL 4 MG/2ML IJ SOLN
INTRAMUSCULAR | Status: DC | PRN
  Administered 2018-07-01: 4 mg via INTRAVENOUS

## 2018-07-01 MED ORDER — MIDAZOLAM HCL 5 MG/5ML IJ SOLN
INTRAMUSCULAR | Status: DC | PRN
  Administered 2018-07-01: 2 mg via INTRAVENOUS

## 2018-07-01 MED ORDER — METHOCARBAMOL 500 MG PO TABS
500.0000 mg | ORAL_TABLET | Freq: Once | ORAL | Status: AC
  Administered 2018-07-01: 500 mg via ORAL

## 2018-07-01 MED ORDER — HEMOSTATIC AGENTS (NO CHARGE) OPTIME
TOPICAL | Status: DC | PRN
  Administered 2018-07-01: 1 via TOPICAL

## 2018-07-01 MED ORDER — ONDANSETRON HCL 4 MG/2ML IJ SOLN
INTRAMUSCULAR | Status: AC
  Filled 2018-07-01: qty 2

## 2018-07-01 MED ORDER — SODIUM CHLORIDE 0.9% FLUSH: 3.0000 mL | INTRAVENOUS | Status: DC | PRN

## 2018-07-01 MED ORDER — OXYCODONE HCL 5 MG PO TABS
10.0000 mg | ORAL_TABLET | ORAL | Status: DC | PRN
  Administered 2018-07-01 – 2018-07-02 (×5): 10 mg via ORAL
  Filled 2018-07-01 (×5): qty 2

## 2018-07-01 MED ORDER — TAMSULOSIN HCL 0.4 MG PO CAPS
0.4000 mg | ORAL_CAPSULE | Freq: Every day | ORAL | Status: DC
  Administered 2018-07-02: 0.4 mg via ORAL
  Filled 2018-07-01: qty 1

## 2018-07-01 MED ORDER — OXYCODONE HCL 5 MG PO TABS
5.0000 mg | ORAL_TABLET | Freq: Once | ORAL | Status: AC | PRN
  Administered 2018-07-01: 5 mg via ORAL

## 2018-07-01 MED ORDER — SODIUM CHLORIDE 0.9 % IV SOLN: 250.0000 mL | INTRAVENOUS | Status: DC

## 2018-07-01 MED ORDER — LIDOCAINE 2% (20 MG/ML) 5 ML SYRINGE
INTRAMUSCULAR | Status: AC
  Filled 2018-07-01: qty 5

## 2018-07-01 MED ORDER — BUPIVACAINE-EPINEPHRINE (PF) 0.25% -1:200000 IJ SOLN
INTRAMUSCULAR | Status: AC
  Filled 2018-07-01: qty 30

## 2018-07-01 MED ORDER — INSULIN ASPART 100 UNIT/ML ~~LOC~~ SOLN: 0.0000 [IU] | Freq: Every day | SUBCUTANEOUS | Status: DC

## 2018-07-01 MED ORDER — OXYCODONE HCL 5 MG PO TABS: 5.0000 mg | ORAL_TABLET | ORAL | Status: DC | PRN

## 2018-07-01 MED ORDER — OXYCODONE-ACETAMINOPHEN 10-325 MG PO TABS: 1.0000 | ORAL_TABLET | Freq: Four times a day (QID) | ORAL | 0 refills | Status: AC | PRN

## 2018-07-01 SURGICAL SUPPLY — 62 items
BLADE CLIPPER SURG (BLADE) IMPLANT
BONE VIVIGEN FORMABLE 1.3CC (Bone Implant) ×3 IMPLANT
CABLE BIPOLOR RESECTION CORD (MISCELLANEOUS) ×3 IMPLANT
CANISTER SUCT 3000ML PPV (MISCELLANEOUS) ×3 IMPLANT
CLOSURE STERI-STRIP 1/2X4 (GAUZE/BANDAGES/DRESSINGS) ×1
CLSR STERI-STRIP ANTIMIC 1/2X4 (GAUZE/BANDAGES/DRESSINGS) ×2 IMPLANT
COVER MAYO STAND STRL (DRAPES) ×9 IMPLANT
COVER SURGICAL LIGHT HANDLE (MISCELLANEOUS) ×6 IMPLANT
COVER WAND RF STERILE (DRAPES) IMPLANT
CRADLE DONUT ADULT HEAD (MISCELLANEOUS) ×3 IMPLANT
DEVICE ENDSKLTN IMPL 16X14X7X6 (Cage) ×1 IMPLANT
DRAPE C-ARM 42X72 X-RAY (DRAPES) ×3 IMPLANT
DRAPE POUCH INSTRU U-SHP 10X18 (DRAPES) ×3 IMPLANT
DRAPE SURG 17X23 STRL (DRAPES) ×3 IMPLANT
DRAPE U-SHAPE 47X51 STRL (DRAPES) ×3 IMPLANT
DRSG OPSITE POSTOP 3X4 (GAUZE/BANDAGES/DRESSINGS) ×3 IMPLANT
DRSG OPSITE POSTOP 4X6 (GAUZE/BANDAGES/DRESSINGS) ×3 IMPLANT
DURAPREP 26ML APPLICATOR (WOUND CARE) ×3 IMPLANT
ELECT COATED BLADE 2.86 ST (ELECTRODE) ×3 IMPLANT
ELECT PENCIL ROCKER SW 15FT (MISCELLANEOUS) ×3 IMPLANT
ELECT REM PT RETURN 9FT ADLT (ELECTROSURGICAL) ×3
ELECTRODE REM PT RTRN 9FT ADLT (ELECTROSURGICAL) ×1 IMPLANT
ENDOSKELETON IMPLANT 16X14X7X6 (Cage) ×3 IMPLANT
FLOSEAL 10ML (HEMOSTASIS) ×3 IMPLANT
GLOVE BIO SURGEON STRL SZ 6.5 (GLOVE) ×2 IMPLANT
GLOVE BIO SURGEONS STRL SZ 6.5 (GLOVE) ×1
GLOVE BIOGEL PI IND STRL 6.5 (GLOVE) ×1 IMPLANT
GLOVE BIOGEL PI IND STRL 8.5 (GLOVE) ×1 IMPLANT
GLOVE BIOGEL PI INDICATOR 6.5 (GLOVE) ×2
GLOVE BIOGEL PI INDICATOR 8.5 (GLOVE) ×2
GLOVE SS BIOGEL STRL SZ 8.5 (GLOVE) ×1 IMPLANT
GLOVE SUPERSENSE BIOGEL SZ 8.5 (GLOVE) ×2
GOWN STRL REUS W/ TWL LRG LVL3 (GOWN DISPOSABLE) ×1 IMPLANT
GOWN STRL REUS W/TWL 2XL LVL3 (GOWN DISPOSABLE) ×3 IMPLANT
GOWN STRL REUS W/TWL LRG LVL3 (GOWN DISPOSABLE) ×2
KIT BASIN OR (CUSTOM PROCEDURE TRAY) ×3 IMPLANT
KIT TURNOVER KIT B (KITS) ×3 IMPLANT
NEEDLE HYPO 22GX1.5 SAFETY (NEEDLE) ×3 IMPLANT
NEEDLE SPNL 18GX3.5 QUINCKE PK (NEEDLE) ×3 IMPLANT
NS IRRIG 1000ML POUR BTL (IV SOLUTION) ×3 IMPLANT
PACK ORTHO CERVICAL (CUSTOM PROCEDURE TRAY) ×3 IMPLANT
PACK UNIVERSAL I (CUSTOM PROCEDURE TRAY) ×3 IMPLANT
PAD ARMBOARD 7.5X6 YLW CONV (MISCELLANEOUS) ×6 IMPLANT
PATTIES SURGICAL .25X.25 (GAUZE/BANDAGES/DRESSINGS) IMPLANT
PIN DISTRACTION 14 (PIN) ×6 IMPLANT
PLATE SKYLINE 12MM (Plate) ×3 IMPLANT
RESTRAINT LIMB HOLDER UNIV (RESTRAINTS) ×3 IMPLANT
SCREW SKYLINE 14MM SD-VA (Screw) ×6 IMPLANT
SCREW SKYLINE 16MM (Screw) ×6 IMPLANT
SPONGE INTESTINAL PEANUT (DISPOSABLE) ×6 IMPLANT
SPONGE SURGIFOAM ABS GEL SZ50 (HEMOSTASIS) ×3 IMPLANT
SUT BONE WAX W31G (SUTURE) ×3 IMPLANT
SUT MON AB 3-0 SH 27 (SUTURE) ×2
SUT MON AB 3-0 SH27 (SUTURE) ×1 IMPLANT
SUT VIC AB 2-0 CT1 18 (SUTURE) ×3 IMPLANT
SYR BULB IRRIGATION 50ML (SYRINGE) ×3 IMPLANT
SYR CONTROL 10ML LL (SYRINGE) ×3 IMPLANT
TAPE CLOTH 4X10 WHT NS (GAUZE/BANDAGES/DRESSINGS) ×3 IMPLANT
TAPE UMBILICAL COTTON 1/8X30 (MISCELLANEOUS) ×3 IMPLANT
TOWEL GREEN STERILE (TOWEL DISPOSABLE) ×3 IMPLANT
TOWEL GREEN STERILE FF (TOWEL DISPOSABLE) ×3 IMPLANT
WATER STERILE IRR 1000ML POUR (IV SOLUTION) IMPLANT

## 2018-07-01 NOTE — H&P (Signed)
H&P  Patient is a 73 yo male PMH: cancer, DM, heart problems, GI ulcers, degeneration of cervical intervertebral disc with radiculopathy who has been experiencing neck and radicular left arm pain for quite some time now. Unfortunately, he had some medical issues which required emergent abdominal surgery and the patient to go off of plavix, his cardiologist does not want him to go off of plavix again until June, therefore he has not gotten the repeat injection. Patient continues to complain of severe debilitating neck pain and radicular left arm pain without significant change in symptoms. Fortunately he denies any weakness. He has expressed that since the first injection was not helpful he does not necessarily want to get a second injection in the summer, but would rather move forward with surgery at that time. Currently he is managing his pain with Tylenol, hydrocodone as prescribed at the [place], and topical creams; he cannot take anti-inflammatories due to being on Plavix.  Allergies    LATEX: Rash (Mild)    NKDA   Medications   cetaZOLAMIDE ER 500 mg capsule,extended release  12/10/16 filled  [place]   benzonatate 100 mg capsule  02/06/16 filled  [place]   carvediloL 6.25 mg tablet  07/09/17 filled  [place]   clopidogreL 75 mg tablet  07/30/17 filled  [place]   fluticasone propionate 50 mcg/actuation nasal spray,suspension  02/06/16 filled  [place]   HYDROcodone 5 mg-acetaminophen 325 mg tablet  03/25/16 filled  [place]   metroNIDAZOLE 500 mg tablet  07/25/17 filled  [place]   neomycin 500 mg tablet  07/25/17 filled  [place]   nitroglycerin 0.4 mg sublingual tablet  05/29/17 filled  [place]   pantoprazole 40 mg tablet,delayed release  01/19/18 filled  [place]   tobramycin 0.3 % eye drops  04/02/17 filled  [place]   Problems  Shoulder pain - Onset:  12/02/2017  Degeneration of cervical intervertebral disc - Onset: 12/15/2017  Neck pain - Onset: 12/02/2017  Cervical radiculopathy - Onset: 05/19/2018  Pain in right knee - Onset: 02/18/2017   Family History  Father  - Heart disease    Social History  Tobacco Smoking Status: Former smoker Tobacco-years of use: 10 Alcohol intake: None  Surgical History Other - 01/28/2017  Cataract - 01/29/2016  Carpal Tunnel Release - 01/29/2016  Rotator Cuff Surgery - 01/29/2012  Carpal Tunnel Release - 01/28/2010  Other - 01/28/2010  Hip Joint Replacement - 01/29/2007  Hip Joint Replacement - 01/29/2004  Knee Arthroscopy - 01/28/2002  Other - 01/28/1997  Other - 01/29/1991    Physical Exam   Patient is a 73 year old male.  Clinical exam: Patient is alert and oriented 3. No shortness of breath or chest pain. CV: RRR, no rubs, murmers, gallops  Pulm: CTAB Abdomen: Soft and nontender. No loss of bowel and bladder control, no rebound tenderness Neck: Significant neck pain radiating into the left upper extremity. Pain and loss in range of motion of the cervical spine. Neuro: 5/5 motor strength in the upper external he bilaterally. Positive left Spurling sign with reproduction of C6 radicular pain. Positive loss in sensation light touch in the C6 dermatome along with dysesthesias in the C6 dermatome. 1+ deep tendon reflexes symmetrical in the upper extremity Reflexes: Hoffman: Negative Babinski: Negative Gait: Normal Musculoskeletal: No significant pain with range of motion of the shoulder, elbow, wrist.   MRI: Cervical MRI: completed on 05/26/18 was  reviewed with the patient. I have also reviewed the radiology report. No change in his significant left foraminal stenosis with compression of the exiting C6 nerve root. Moderate degenerative changes at the adjacent C4-5 level. No cord signal changes. No significant central canal stenosis is noted. No acute fracture seen.   Assessment / Plan   Diagnosis: Paul is a very pleasant 73 year old him with significant neck and neuropathic left arm pain. 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 Despite self directed exercises and attempted formal physical therapy he continues to have severe debilitating pain. As a result he would like to move forward with surgery. Plan:  We have discussed in great detail the ACDF. I have also reviewed the risks and benefits and all of his questions were encouraged and addressed. Risks and benefits of surgery were discussed with the patient. These include: Infection, bleeding, death, stroke, paralysis, ongoing or worse pain, need for additional surgery, nonunion, leak of spinal fluid, adjacent segment degeneration requiring additional fusion surgery. Pseudoarthrosis (nonunion)requiring supplemental posterior fixation. Throat pain, swallowing difficulties, hoarseness or change in voice.   All of patients questions were invited and answered.   We will move forward with a C5-6 ACDF at [facility] on 07/01/2018

## 2018-07-01 NOTE — Transfer of Care (Signed)
Immediate Anesthesia Transfer of Care Note  Patient: [name]  Procedure(s) Performed: Anterior cervical discectomy and fusion C5-6 (N/A Neck)  Patient Location: PACU  Anesthesia Type:General  Level of Consciousness: oriented, drowsy and patient cooperative  Airway & Oxygen Therapy: Patient Spontanous Breathing and Patient connected to nasal cannula oxygen  Post-op Assessment: Report given to RN and Post -op Vital signs reviewed and stable  Post vital signs: Reviewed  Last Vitals:  Vitals Value Taken Time  BP 123/68 07/01/2018 11:05 AM  Temp    Pulse 67 07/01/2018 11:06 AM  Resp 24 07/01/2018 11:06 AM  SpO2 100 % 07/01/2018 11:06 AM  Vitals shown include unvalidated device data.  Last Pain:  Vitals:   07/01/18 0743  TempSrc:   PainSc: 4          Complications: No apparent anesthesia complications

## 2018-07-01 NOTE — Anesthesia Postprocedure Evaluation (Signed)
Anesthesia Post Note  Patient: [name]  Procedure(s) Performed: Anterior cervical discectomy and fusion C5-6 (N/A Neck)     Patient location during evaluation: PACU Anesthesia Type: General Level of consciousness: awake and alert Pain management: pain level controlled Vital Signs Assessment: post-procedure vital signs reviewed and stable Respiratory status: spontaneous breathing, nonlabored ventilation and respiratory function stable Cardiovascular status: blood pressure returned to baseline and stable Postop Assessment: no apparent nausea or vomiting Anesthetic complications: no    Last Vitals:  Vitals:   07/01/18 1414 07/01/18 1429  BP: 120/74 (!) 143/85  Pulse: 64 67  Resp:  16  Temp:  36.5 C  SpO2: 96% 100%           Lidia Collum

## 2018-07-01 NOTE — Plan of Care (Signed)
Problem: Education: Goal: Knowledge of General Education information will improve Description Including pain rating scale, medication(s)/side effects and non-pharmacologic comfort measures Outcome: Progressing   Problem: Health Behavior/Discharge Planning: Goal: Ability to manage health-related needs will improve Outcome: Progressing   Problem: Clinical Measurements: Goal: Respiratory complications will improve Outcome: Progressing Goal: Cardiovascular complication will be avoided Outcome: Progressing   Problem: Nutrition: Goal: Adequate nutrition will be maintained Outcome: Progressing   Problem: Coping: Goal: Level of anxiety will decrease Outcome: Progressing   Problem: Pain Managment: Goal: General experience of comfort will improve Outcome: Progressing   Problem: Safety: Goal: Ability to remain free from injury will improve Outcome: Progressing   Problem: Skin Integrity: Goal: Risk for impaired skin integrity will decrease Outcome: Progressing   

## 2018-07-01 NOTE — Op Note (Signed)
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  7 mm lordotic medium cage.  Depey anterior cervical skyline plate 12 mm length with 16 mm locking screws into the body of C5, and 14 mm locking screws into the body of C6.  Allograft: vivogen  Indications: Jamorris is a very pleasant 73 year old gentleman with persistent debilitating radicular arm pain and significant neck pain.  Clinical exam confirm degenerative cervical disc disease with spondylitic radiculopathy secondary to foraminal stenosis.  As result of the failure of conservative care we elected to move forward with surgery.  All appropriate risks benefits and alternatives were discussed with the patient and consent was obtained.  Operative report: Patient was brought the operating room placed upon the operating room table.  After successful induction of general anesthesia and endotracheal ovation teds SCDs were applied.  The arms were tucked at the side and the anterior cervical spine was prepped and draped in a standard fashion.  Timeout was taken to confirm patient procedure and all other important data.  Intraoperative fluoroscopy was used to identify the C5-6 disc space.  The incision site was marked out and then infiltrated with quarter percent Marcaine with epinephrine.  Transverse incision was made and sharp dissection was carried out down to the platysma.  The platysma was sharply incised and I continued to perform a standard Smith-Robinson approach to the anterior cervical spine.  I dissected along the medial border the sternocleidomastoid into the deep cervical fascia.  Identified and mobilized the omohyoid muscle and continued my dissection down to the medial aspect of the carotid sheath.  I was  able to now bluntly dissect and sweep the esophagus and trachea to the right palpate and protect the carotid sheath with laterally.  The L retractor was then placed and using Kitner dissectors I was able to mobilize the remaining prevertebral fascia and expose the anterior longitudinal ligament.  A needle was placed into the C5-6 disc space and intraoperative lateral fluoroscopy view was taken to confirm the appropriate level.  Once confirmed I marked the disc space with Bovie and then began mobilizing the longus coli muscles from the superior aspect of C5 to the inferior aspect of C6.  The anterior traction spurs that had developed over the disc space were then removed with a [employer].  I then placed the Caspar retracting blades deflated the endotracheal cuff and expanded the retractor blades to the appropriate width.  Endotracheal cuff was then reinflated and I continued with the surgery.  Annulotomy was performed with a 15 blade scalpel and then using pituitary rongeurs I resected the bulk of the disc material.  Distraction pins were placed into the body of C5 and C6 and I gently distracted the disc space with a lamina spreader.  I maintained the distraction with the distraction pin set.  Using a 2 mm Kerrison Roger I remove the overhanging osteophyte from the inferior aspect of the C5 vertebral body.  Using curettes I removed all the remaining disc material.  At this point I could visualize the posterior annulus.  Using my 1 mm Kerrison rongeurs I trimmed trim down the posterior osteophyte from the body of C5 and C6.  I then used my fine nerve hook to dissect through the posterior annulus and developed a plane and resected with a 1 mm Kerrison  Roger.  I then resected the PLL in a similar fashion.  At this point I was able to get underneath the uncovertebral joint with my 1 mm Kerrison Roger and set the osteophyte to allow for more generous foraminotomies.  Once I had this complete I then rasped the  endplates to ensure I had bleeding subchondral bone.  Using the trial implants I sized the intervertebral space.  I then obtained the 7 mm medium lordotic cage packed with the appropriate allograft and malleted to the appropriate depth.  Distraction pins were removed and I placed bone wax in the screw holes to provide hemostasis  The 12 mm anterior cervical plate was then affixed to the body of C5 with the length self drilling screws, and 14 mm length self drilling screws into the bodies C6.  All screws had excellent purchase.  Once all screws were tightened down I then placed the final locking device to lock the screws in place.  I then remove the self retracting blades and irrigated the wound copiously normal saline.  Using FloSeal and bipolar I was able to obtain and maintain hemostasis.  After final irrigation I returned the trachea esophagus to midline and then closed the platysma with interrupted 2-0 Vicryl suture and the skin with a 3-0 Monocryl.  Steri-Strips and an Designer, multimedia were then applied.  X-rays were taken and they were satisfactory both the AP and lateral planes.  The hardware was properly positioned and there was adequate restoration of the foraminal volume.  Patient was ultimately extubated and transferred the PACU without incident.  He had of the case all needle sponge counts were correct.  There were no adverse intraoperative events.

## 2018-07-01 NOTE — Discharge Instructions (Signed)
Today you will be discharged from the hospital.  The purpose of the following handout is to help guide you over the next 2 weeks.  First and foremost, be sure you have a follow up appointment with Dr. Rolena Infante 2 weeks from the time of your surgery to have your sutures removed.  Please call [facility] [phone] to schedule or confirm this appointment.      Brace You do not have to wear the collar while lying in bed or sitting in a high-backed chair, eating, sleeping or showering.  Other than these instances, you must wear the brace.  You may NOT wear the collar while driving a vehicle (see driving restrictions below).  It is advisable that you wear the collar in public places or while traveling in a car as a passenger.  Dr. Rolena Infante will discuss further use of the collar at your 2 week postop visit.  Wound Care You may SHOWER 5 days from the date of surgery.  Shower directly over the steri-strips.  DO NOT scrub or submerge (bath tub, swimming pool, hot tub, etc.) the area.  Pat to dry following your shower.  There is no need for additional dressings other than the steri-strips.  Allow the steri-strips to fall off on their own.  Once the strips have fallen off, you may leave the area undressed.  DO NOT apply lotion/cream/ointment to the area.  The wound must remain dry at all times other than while showering.  Dr. Rolena Infante or his staff will remove your stiches at your first postop visit and give you additional instructions regarding wound care at that time.   Activity NO DRIVING FOR 2 WEEKS.  No lifting over 5 pounds (approximately a gallon of milk).  No bending, stooping, squatting or twisting.  No overhead activities.  We encourage you to walk (short distances and often throughout the day) as you can tolerate.  A good rule of thumb is to get up and move once or twice every hour.  You may go up and down stairs carefully.  As you continue to recover, Dr. Rolena Infante will address and adjust  restrictions to your activities until no further restrictions are needed.  However, until your first postop visit, when Dr. Rolena Infante can assess your recovery, you are to follow these instructions.  At the end of this document is a tentative outline of activities for up to 1 year.       Medication You will be discharged from the hospital with medication for pain, spasm, nausea and constipation.  You will be given enough medication to last until your first postop visit in 2 weeks.  Medications WILL NOT BE REFILLED EARLY; therefore, you are to take the medications only as directed.  If you have been given multiple prescriptions, please leave them with your pharmacy.  They can keep them on file for when you need them.  Medications that are lost or stolen WILL NOT be replaced.  We will address the need for continuing certain medications on an individual basis during your postop visit.  We ask that you avoid over the counter anti-inflammatory medications (Advil, Aleve, Motrin) for 3 months.    What you can expect following neck surgery... It is not uncommon to experience a sore throat or difficulty swallowing following neck surgery.  Cold liquids and soft foods are helpful in soothing this discomfort.  There is no specific diet that you are to follow after surgery, however, there are a few things you  should keep in mind to avoid unneeded discomfort.  Take small bites and eat slowly.  Chew your food thoroughly before swallowing.   It is not uncommon to experience incisional soreness or pain in the back of the neck, shoulders or between the shoulder blades.  These symptoms will slowly begin to resolve as you continue to recover, however, they can last for a few weeks.    It is not uncommon to experience INTERMITTENT arm pain following surgery.  This pain can mimic the arm pain you had prior to surgery.  As long as the pain resolves on its own and is not constant, there is no need to become alarmed.   When To  Call If you experience fever >101F, loss of bowel or bladder control, painful swelling in the lower extremities, constant (unresolving) arm pain.  If you experience any of these symptoms, please call [facility] [phone].  What's Next As mentioned earlier, you will follow up with Dr. Rolena Infante in 2 weeks.  At that time, we will likely remove your stitches and discuss additional aspects of your recovery.                   ACTIVITY GUIDELINES ANTERIOR CERVICAL DISECTOMY AND FUSION  Activity Discharge 2 weeks 6 weeks 3 months 6 months 1 year  Shower 5 days        Submerge the wound  no no yes     Walking outdoors yes       Lifting 5 lbs yes       Climbing stairs yes       Cooking yes       Car rides (less than 30 minutes) yes       Car rides (greater than 30 minutes) no varies yes     Air travel no varies yes     Short outings [employer], visits, etc...) yes       School no no yes     Driving a car no no varies yes    Light upper extremity exercises no no varies yes    Stationary bike no no yes     Swimming (no diving) no no no varies yes   Vacuuming, laundry, mopping no no no varies yes   Biking outdoors no no no no varies yes  Light jogging no no no varies yes   Low impact aerobics no no no varies yes   Non-contact sports (tennis, golf) no no no varies yes   Hunting (no tree climbing) no no no varies yes   Dancing (non-gymnastics) no no no varies yes   Down-hill skiing (experienced skier) no no no no yes   Down-hill skiing (novice) no no no no yes   Cross-country skiing no no no no yes   Horseback riding (noncompetitive)  no no no no yes   Horseback riding (competitive) no no no no varies yes  Gardening/landscaping no no no varies yes   House repairs no no no varies varies yes  Lifting up to 50 lbs no no no no varies yes     RESTART PLAVIX Friday MORNING RESTART ASPIRIN Saturday MORNING    Post Anesthesia Home Care  Instructions  Activity: Get plenty of rest for the remainder of the day. A responsible individual must stay with you for 24 hours following the procedure.  For the next 24 hours, DO NOT: -Drive a car -Paediatric nurse -Drink alcoholic beverages -Take any medication unless instructed by your physician -Make any  legal decisions or sign important papers.  Meals: Start with liquid foods such as gelatin or soup. Progress to regular foods as tolerated. Avoid greasy, spicy, heavy foods. If nausea and/or vomiting occur, drink only clear liquids until the nausea and/or vomiting subsides. Call your physician if vomiting continues.  Special Instructions/Symptoms: Your throat may feel dry or sore from the anesthesia or the breathing tube placed in your throat during surgery. If this causes discomfort, gargle with warm salt water. The discomfort should disappear within 24 hours.  If you had a scopolamine patch placed behind your ear for the management of post- operative nausea and/or vomiting:  1. The medication in the patch is effective for 72 hours, after which it should be removed.  Wrap patch in a tissue and discard in the trash. Wash hands thoroughly with soap and water. 2. You may remove the patch earlier than 72 hours if you experience unpleasant side effects which may include dry mouth, dizziness or visual disturbances. 3. Avoid touching the patch. Wash your hands with soap and water after contact with the patch.

## 2018-07-01 NOTE — Brief Op Note (Signed)
07/01/2018  11:05 AM  PATIENT:  [name]  73 y.o. 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

## 2018-07-01 NOTE — Progress Notes (Signed)
Pt arrived to room 5N31 via wheelchair after surgery. Pt now resting in chair. See assessment. Will continue to monitor.

## 2018-07-01 NOTE — Anesthesia Procedure Notes (Signed)
Procedure Name: Intubation Date/Time: 07/01/2018 8:33 AM Performed by: Jenne Campus, CRNA Pre-anesthesia Checklist: Patient identified, Emergency Drugs available, Suction available and Patient being monitored Patient Re-evaluated:Patient Re-evaluated prior to induction Oxygen Delivery Method: Circle System Utilized Preoxygenation: Pre-oxygenation with 100% oxygen Induction Type: IV induction Grade View: Grade I Tube type: Oral Tube size: 7.5 mm Number of attempts: 1 Airway Equipment and Method: Stylet and Video-laryngoscopy Placement Confirmation: ETT inserted through vocal cords under direct vision,  positive ETCO2 and breath sounds checked- equal and bilateral Secured at: 23 cm Tube secured with: Tape Dental Injury: Teeth and Oropharynx as per pre-operative assessment

## 2018-07-02 ENCOUNTER — Encounter (HOSPITAL_COMMUNITY): Payer: Self-pay | Admitting: Orthopedic Surgery

## 2018-07-02 DIAGNOSIS — M5412 Radiculopathy, cervical region: Secondary | ICD-10-CM | POA: Diagnosis not present

## 2018-07-02 LAB — GLUCOSE, CAPILLARY: Glucose-Capillary: 146 mg/dL — ABNORMAL HIGH (ref 70–99)

## 2018-07-02 LAB — HEMOGLOBIN A1C
Hgb A1c MFr Bld: 6.2 % — ABNORMAL HIGH (ref 4.8–5.6)
Mean Plasma Glucose: 131.24 mg/dL

## 2018-07-02 MED ORDER — HYDROCODONE-ACETAMINOPHEN 10-325 MG PO TABS: 1.0000 | ORAL_TABLET | Freq: Four times a day (QID) | ORAL | 0 refills | Status: AC | PRN

## 2018-07-02 NOTE — Evaluation (Signed)
Physical Therapy Evaluation & Discharge  Patient Details Name: [name] MRN: [id number] DOB: 1945-06-12 Today's Date: 07/02/2018   History of Present Illness  Pt is a 73 y.o. male with L C6 radicular pain, now s/p ACDF C5-6 on 07/01/18. PMH includes PTSD, DM, diabetic neuropathy, CAD, HTN, bilateral THAs, back sx.    Clinical Impression  Patient evaluated by Physical Therapy with no further acute PT needs identified. PTA, pt indep and lives with wife, retired from work. Educ re: cervical precautions, brace application, positioning, and importance of mobility. Today, pt indep with ambulation and stair training. All education has been completed and the patient has no further questions. Acute PT is signing off. Thank you for this referral.    Follow Up Recommendations No PT follow up    Equipment Recommendations  None recommended by PT    Recommendations for Other Services       Precautions / Restrictions Precautions Precautions: Cervical Precaution Booklet Issued: Yes (comment) Precaution Comments: Verbally reviewed precautions Required Braces or Orthoses: Cervical Brace Cervical Brace: Hard collar;Other (comment)(can have off when sitting, in bed, to bathroom) Restrictions Weight Bearing Restrictions: No      Mobility  Bed Mobility               General bed mobility comments: Received sitting in recliner  Transfers Overall transfer level: Independent Equipment used: None                Ambulation/Gait Ambulation/Gait assistance: Independent Gait Distance (Feet): 250 Feet Assistive device: None Gait Pattern/deviations: Step-through pattern;Decreased stride length   Gait velocity interpretation: 1.31 - 2.62 ft/sec, indicative of limited community ambulator General Gait Details: Slow, steady gait without DME  Stairs Stairs: Yes Stairs assistance: Modified independent (Device/Increase time) Stair Management: One rail Right;Alternating  pattern;Forwards Number of Stairs: 4 General stair comments: Ascend/descended steps mod indep with rail support  Wheelchair Mobility    Modified Rankin (Stroke Patients Only)       Balance Overall balance assessment: Needs assistance   Sitting balance-Leahy Scale: Good       Standing balance-Leahy Scale: Good                               Pertinent Vitals/Pain Pain Assessment: 0-10 Pain Score: 4  Pain Location: cervical spine incision site Pain Descriptors / Indicators: Guarding;Sore Pain Intervention(s): Monitored during session    Home Living Family/patient expects to be discharged to:: Private residence Living Arrangements: Spouse/significant other Available Help at Discharge: Family Type of Home: House Home Access: Stairs to enter Entrance Stairs-Rails: Right Entrance Stairs-Number of Steps: 4 Home Layout: One level 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 Comments: Wife currently at [facility] with kidney stones; pastor can drive pt for time being    Prior Function Level of Independence: Independent         Comments: driving, retired     Journalist, newspaper   Dominant Hand: Right    Extremity/Trunk Assessment   Upper Extremity Assessment Upper Extremity Assessment: LUE deficits/detail LUE Deficits / Details: WFL with decreased sensation (present from before sx) LUE Sensation: decreased light touch LUE Coordination: WNL    Lower Extremity Assessment Lower Extremity Assessment: Overall WFL for tasks assessed    Cervical / Trunk Assessment Cervical / Trunk Assessment: Other exceptions Cervical / Trunk Exceptions: s/p cervical sx  Communication   Communication: No difficulties  Cognition Arousal/Alertness: Awake/alert Behavior During Therapy: WFL for tasks assessed/performed Overall  Cognitive Status: Within Functional Limits for tasks assessed                                        General Comments General comments  (skin integrity, edema, etc.): Reviewed brace application and washing    Exercises     Assessment/Plan    PT Assessment Patent does not need any further PT services  PT Problem List         PT Treatment Interventions      PT Goals (Current goals can be found in the Care Plan section)  Acute Rehab PT Goals Patient Stated Goal: get back to crappy fishing PT Goal Formulation: All assessment and education complete, DC therapy    Frequency     Barriers to discharge        Co-evaluation               AM-PAC PT "6 Clicks" Mobility  Outcome Measure Help needed turning from your back to your side while in a flat bed without using bedrails?: None Help needed moving from lying on your back to sitting on the side of a flat bed without using bedrails?: None Help needed moving to and from a bed to a chair (including a wheelchair)?: None Help needed standing up from a chair using your arms (e.g., wheelchair or bedside chair)?: None Help needed to walk in hospital room?: None Help needed climbing 3-5 steps with a railing? : None 6 Click Score: 24    End of Session Equipment Utilized During Treatment: Cervical collar Activity Tolerance: Patient tolerated treatment well Patient left: in chair;with call bell/phone within reach Nurse Communication: Mobility status PT Visit Diagnosis: Other abnormalities of gait and mobility (R26.89)    Time: 0900-0909 PT Time Calculation (min) (ACUTE ONLY): 9 min   Charges:   PT Evaluation $PT Eval Low Complexity: [place], PT, DPT Acute Rehabilitation Services  Pager [phone] Office [place] 07/02/2018, 10:36 AM

## 2018-07-02 NOTE — Plan of Care (Signed)
  Problem: Clinical Measurements: Goal: Ability to maintain clinical measurements within normal limits will improve Outcome: Progressing   Problem: Activity: Goal: Risk for activity intolerance will decrease Outcome: Progressing   Problem: Nutrition: Goal: Adequate nutrition will be maintained Outcome: Progressing   Problem: Coping: Goal: Level of anxiety will decrease Outcome: Progressing   Problem: Elimination: Goal: Will not experience complications related to bowel motility Outcome: Progressing   Problem: Pain Managment: Goal: General experience of comfort will improve Outcome: Progressing

## 2018-07-02 NOTE — Progress Notes (Signed)
    Subjective: Procedure(s) (LRB): Anterior cervical discectomy and fusion C5-6 (N/A) 1 Day Post-Op  Patient reports pain as 2 on 0-10 scale.  Reports decreased arm pain reports incisional neck pain   Positive void Negative bowel movement Positive flatus Negative chest pain or shortness of breath  Objective: Vital signs in last 24 hours: Temp:  [97.2 F (36.2 C)-97.9 F (36.6 C)] 97.9 F (36.6 C) (06/04 0911) Pulse Rate:  [62-79] 79 (06/04 0911) Resp:  [10-50] 18 (06/04 0911) BP: (118-153)/(68-85) 147/77 (06/04 0911) SpO2:  [87 %-100 %] 97 % (06/04 0911)  Intake/Output from previous day: 06/03 0701 - 06/04 0700 In: 2097.7 [P.O.:290; I.V.:1716.8; IV Piggyback:90.9] Out: 280 [Urine:250; Blood:30]  Labs: No results for input(s): WBC, RBC, HCT, PLT in the last 72 hours. No results for input(s): NA, K, CL, CO2, BUN, CREATININE, GLUCOSE, CALCIUM in the last 72 hours. No results for input(s): LABPT, INR in the last 72 hours.  Physical Exam: Neurologically intact ABD soft Intact pulses distally Incision: dressing C/D/I and no drainage Compartment soft Body mass index is 27.26 kg/m.  Assessment/Plan: Patient stable  xrays n/a Mobilization with physical therapy Encourage incentive spirometry Continue care  Advance diet Up with therapy  Doing well - plan on d/c to home today  Melina Schools, MD [facility] [phone]

## 2018-07-02 NOTE — Evaluation (Signed)
Occupational Therapy Evaluation Patient Details Name: [name] MRN: [id number] DOB: 07-28-1945 Today's Date: 07/02/2018    History of Present Illness Pt is a 73 y.o. male with L C6 radicular pain, now s/p ACDF C5-6 on 07/01/18. PMH includes PTSD, DM, diabetic neuropathy, CAD, HTN, bilateral THAs, back sx.   Clinical Impression   Pt was independent PTA, Pt is currently mod I for ADL. Pt provided cervical precaution handout and educated on compensatory strategies for ADL in full. Pt was then able to get dressed mod I and perform all transfers mod I. OT to sign off as Pt's education complete and Pt able to verbalize, demonstrate, and perform teach back of cervical precautions as well as compensatory strategies.     Follow Up Recommendations  No OT follow up;Supervision - Intermittent    Equipment Recommendations  None recommended by OT    Recommendations for Other Services       Precautions / Restrictions Precautions Precautions: Cervical Precaution Booklet Issued: Yes (comment) Required Braces or Orthoses: Cervical Brace Cervical Brace: Hard collar;Other (comment)(when OOB and walking, bathroom privledges) Restrictions Weight Bearing Restrictions: No      Mobility Bed Mobility               General bed mobility comments: OOB in recliner at beginning and end of session, used handout to educate Pt  Transfers Overall transfer level: Modified independent Equipment used: None                  Balance Overall balance assessment: Mild deficits observed, not formally tested                                         ADL either performed or assessed with clinical judgement   ADL Overall ADL's : Modified independent                                       General ADL Comments: able to don/doff brace, get dressed completely, educated in compensatory strategies for LB ADL and standing grooming tasks as well as showering.      Vision  Patient Visual Report: No change from baseline       Perception     Praxis      Pertinent Vitals/Pain Pain Assessment: 0-10 Pain Score: 4  Pain Location: cervical spine incision site Pain Descriptors / Indicators: Guarding;Sore Pain Intervention(s): Monitored during session     Hand Dominance Right   Extremity/Trunk Assessment Upper Extremity Assessment Upper Extremity Assessment: LUE deficits/detail LUE Deficits / Details: WFL with decreased sensation (present from before sx) LUE Sensation: decreased light touch LUE Coordination: WNL   Lower Extremity Assessment Lower Extremity Assessment: Defer to PT evaluation   Cervical / Trunk Assessment Cervical / Trunk Assessment: Other exceptions Cervical / Trunk Exceptions: s/p cervical sx   Communication Communication Communication: No difficulties   Cognition Arousal/Alertness: Awake/alert Behavior During Therapy: WFL for tasks assessed/performed Overall Cognitive Status: Within Functional Limits for tasks assessed                                     General Comments  VSS    Exercises     Shoulder Instructions      Home Living  Family/patient expects to be discharged to:: Private residence Living Arrangements: Spouse/significant other Available Help at Discharge: Family Type of Home: House Home Access: Stairs to enter Technical brewer of Steps: 4 Entrance Stairs-Rails: Right Home Layout: One level     Bathroom Shower/Tub: Occupational psychologist: Handicapped height Bathroom Accessibility: Yes   Home Equipment: Shower seat          Prior Functioning/Environment Level of Independence: Independent        Comments: driving, retired        Secretary/administrator Problem List: Decreased range of motion;Decreased activity tolerance;Decreased knowledge of use of DME or AE;Decreased knowledge of precautions;Pain      OT Treatment/Interventions:      OT Goals(Current goals can be found in  the care plan section) Acute Rehab OT Goals Patient Stated Goal: get back to crappy fishing OT Goal Formulation: With patient Time For Goal Achievement: 07/16/18 Potential to Achieve Goals: Good  OT Frequency:     Barriers to D/C:            Co-evaluation              AM-PAC OT "6 Clicks" Daily Activity     Outcome Measure Help from another person eating meals?: None Help from another person taking care of personal grooming?: None Help from another person toileting, which includes using toliet, bedpan, or urinal?: None Help from another person bathing (including washing, rinsing, drying)?: None Help from another person to put on and taking off regular upper body clothing?: None Help from another person to put on and taking off regular lower body clothing?: None 6 Click Score: 24   End of Session Nurse Communication: Mobility status;Precautions  Activity Tolerance: Patient tolerated treatment well Patient left: in chair;with call bell/phone within reach  OT Visit Diagnosis: Other symptoms and signs involving the nervous system (R29.898);Pain Pain - part of body: (cervical)                Time: 4967-5916 OT Time Calculation (min): 22 min Charges:  OT General Charges $OT Visit: 1 Visit OT Evaluation $OT Eval Low Complexity: [place] OTR/L Acute Rehabilitation Services Pager: [phone] Office: [place] 07/02/2018, 10:12 AM

## 2018-07-03 MED FILL — Thrombin (Recombinant) For Soln 20000 Unit: CUTANEOUS | Qty: 1 | Status: AC

## 2018-07-14 ENCOUNTER — Other Ambulatory Visit: Payer: Self-pay

## 2018-07-14 ENCOUNTER — Encounter (HOSPITAL_COMMUNITY): Payer: Self-pay

## 2018-07-14 ENCOUNTER — Ambulatory Visit (HOSPITAL_COMMUNITY)
Admission: RE | Admit: 2018-07-14 | Discharge: 2018-07-14 | Disposition: A | Discharge status: Home / Self Care | Payer: Medicare HMO | Source: Ambulatory Visit | Attending: Cardiovascular Disease | Admitting: Cardiovascular Disease

## 2018-07-14 ENCOUNTER — Other Ambulatory Visit (HOSPITAL_COMMUNITY): Payer: Self-pay | Admitting: Orthopedic Surgery

## 2018-07-14 DIAGNOSIS — M7989 Other specified soft tissue disorders: Secondary | ICD-10-CM | POA: Diagnosis not present

## 2018-07-14 DIAGNOSIS — M79605 Pain in left leg: Secondary | ICD-10-CM | POA: Insufficient documentation

## 2018-07-14 NOTE — Progress Notes (Unsigned)
Left lower venous has been completed and is negative for DVT. Preliminary results can be found under CV proc through chart review.  Brenten Janney RVT [facility]  

## 2018-11-16 ENCOUNTER — Other Ambulatory Visit: Payer: Self-pay

## 2018-11-16 ENCOUNTER — Inpatient Hospital Stay: Payer: Medicare HMO | Attending: Oncology | Admitting: Oncology

## 2018-11-16 ENCOUNTER — Inpatient Hospital Stay: Payer: Medicare HMO

## 2018-11-16 VITALS — BP 138/81 | HR 80 | Temp 98.5°F | Resp 17 | Ht 70.0 in | Wt 188.2 lb

## 2018-11-16 DIAGNOSIS — E119 Type 2 diabetes mellitus without complications: Secondary | ICD-10-CM

## 2018-11-16 DIAGNOSIS — C7A012 Malignant carcinoid tumor of the ileum: Secondary | ICD-10-CM

## 2018-11-16 DIAGNOSIS — Z23 Encounter for immunization: Secondary | ICD-10-CM | POA: Insufficient documentation

## 2018-11-16 DIAGNOSIS — R197 Diarrhea, unspecified: Secondary | ICD-10-CM | POA: Insufficient documentation

## 2018-11-16 DIAGNOSIS — I10 Essential (primary) hypertension: Secondary | ICD-10-CM | POA: Diagnosis not present

## 2018-11-16 MED ORDER — INFLUENZA VAC A&B SA ADJ QUAD 0.5 ML IM PRSY
PREFILLED_SYRINGE | INTRAMUSCULAR | Status: AC
  Filled 2018-11-16: qty 0.5

## 2018-11-16 MED ORDER — INFLUENZA VAC A&B SA ADJ QUAD 0.5 ML IM PRSY
0.5000 mL | PREFILLED_SYRINGE | Freq: Once | INTRAMUSCULAR | Status: AC
  Administered 2018-11-16: 0.5 mL via INTRAMUSCULAR

## 2018-11-16 NOTE — Progress Notes (Signed)
  [place] OFFICE PROGRESS NOTE   Diagnosis: Carcinoid tumor  INTERVAL HISTORY:   [name] returns as scheduled.  He continues to have 2-3 loose bowel movements per day.  This has been the pattern since he underwent surgery last year.  He feels well.  Good appetite.  He passed a kidney stone last week.  Objective:  Vital signs in last 24 hours:  Blood pressure 138/81, pulse 80, temperature 98.5 F (36.9 C), temperature source Temporal, resp. rate 17, height 5\' 10"  (1.778 m), weight 188 lb 3.2 oz (85.4 kg), SpO2 100 %.    HEENT: Neck without mass Lymphatics: No cervical, supraclavicular, axillary, or inguinal nodes Cardio: Regular rate and rhythm GI: No mass, nontender, no hepatosplenomegaly Vascular: No leg edema   Lab Results:  Lab Results  Component Value Date   WBC 5.5 06/25/2018   HGB 12.6 (L) 06/25/2018   HCT 39.9 06/25/2018   MCV 88.9 06/25/2018   PLT 190 06/25/2018   NEUTROABS 3.2 01/09/2018    CMP  Lab Results  Component Value Date   NA 144 06/25/2018   K 4.1 06/25/2018   CL 107 06/25/2018   CO2 29 06/25/2018   GLUCOSE 141 (H) 06/25/2018   BUN 19 06/25/2018   CREATININE 1.14 06/25/2018   CALCIUM 9.4 06/25/2018   PROT 5.5 (L) 01/07/2018   ALBUMIN 3.3 (L) 01/07/2018   AST 20 01/07/2018   ALT 21 01/07/2018   ALKPHOS 45 01/07/2018   BILITOT 0.7 01/07/2018   GFRNONAA >60 06/25/2018   GFRAA >60 06/25/2018    Medications: I have reviewed the patient's current medications.   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 Admission 01/06/2018 with a small bowel obstruction secondary to #1 3. Coronary artery disease 4. Diabetes 5. Hypertension 6. History of kidney stones 7. Macular degeneration  Disposition: [name] is in remission from the carcinoid tumor.  We  will follow-up on the chromogranin A level from today.  He will return for office visit and chromogranin a level in 9 months.  [name] received an influenza vaccine today.  Betsy Coder, MD  11/16/2018  12:21 PM

## 2018-11-17 ENCOUNTER — Telehealth: Payer: Self-pay | Admitting: Oncology

## 2018-11-18 LAB — CHROMOGRANIN A: Chromogranin A (ng/mL): 100.3 ng/mL (ref 0.0–101.8)

## 2018-12-02 ENCOUNTER — Telehealth: Payer: Self-pay | Admitting: *Deleted

## 2018-12-02 DIAGNOSIS — C7A012 Malignant carcinoid tumor of the ileum: Secondary | ICD-10-CM

## 2018-12-02 NOTE — Telephone Encounter (Addendum)
Left VM to return call to discuss lab results. Patient returned call and informed of chromogranin result and that protonix can case level to read higher. Will reschedule same lab for 1-2 months and for him to hold his Protonix for 4 doses prior to lab draw. Patient understands and agrees.

## 2018-12-02 NOTE — Telephone Encounter (Signed)
-----   Message from Ladell Pier, MD sent at 12/01/2018  5:00 PM EST ----- Please call patient, chromogranin A is at the upper end of the normal range, may be elevated by Protonix  Hold Protonix for 4 doses and return for a repeat chromogranin A in 1-2 months, follow-up as scheduled

## 2019-02-01 ENCOUNTER — Encounter: Payer: Self-pay | Admitting: Oncology

## 2019-02-03 ENCOUNTER — Telehealth: Payer: Self-pay | Admitting: Oncology

## 2019-02-03 NOTE — Telephone Encounter (Signed)
Scheduled per 1/5 sch msg. Called and spoke with pt, confirmed 1/11 appt

## 2019-02-08 ENCOUNTER — Other Ambulatory Visit: Payer: Self-pay

## 2019-02-08 ENCOUNTER — Inpatient Hospital Stay: Payer: Medicare HMO | Attending: Oncology

## 2019-02-08 DIAGNOSIS — C7A012 Malignant carcinoid tumor of the ileum: Secondary | ICD-10-CM

## 2019-02-10 ENCOUNTER — Encounter: Payer: Self-pay | Admitting: Oncology

## 2019-02-10 LAB — CHROMOGRANIN A: Chromogranin A (ng/mL): 116.1 ng/mL — ABNORMAL HIGH (ref 0.0–101.8)

## 2019-08-16 ENCOUNTER — Inpatient Hospital Stay: Payer: Medicare HMO | Attending: Oncology | Admitting: Oncology

## 2019-08-16 ENCOUNTER — Telehealth: Payer: Self-pay | Admitting: Oncology

## 2019-08-16 ENCOUNTER — Inpatient Hospital Stay: Payer: Medicare HMO

## 2019-08-16 ENCOUNTER — Other Ambulatory Visit: Payer: Self-pay

## 2019-08-16 VITALS — BP 108/73 | HR 71 | Temp 97.6°F | Resp 18 | Ht 70.0 in | Wt 184.8 lb

## 2019-08-16 DIAGNOSIS — E119 Type 2 diabetes mellitus without complications: Secondary | ICD-10-CM | POA: Diagnosis not present

## 2019-08-16 DIAGNOSIS — C7A012 Malignant carcinoid tumor of the ileum: Secondary | ICD-10-CM | POA: Diagnosis not present

## 2019-08-16 DIAGNOSIS — I2510 Atherosclerotic heart disease of native coronary artery without angina pectoris: Secondary | ICD-10-CM | POA: Diagnosis not present

## 2019-08-16 DIAGNOSIS — I10 Essential (primary) hypertension: Secondary | ICD-10-CM | POA: Diagnosis not present

## 2019-08-16 DIAGNOSIS — Z8616 Personal history of COVID-19: Secondary | ICD-10-CM | POA: Diagnosis not present

## 2019-08-16 DIAGNOSIS — Z85060 Personal history of malignant carcinoid tumor of small intestine: Secondary | ICD-10-CM | POA: Diagnosis present

## 2019-08-16 DIAGNOSIS — Z87442 Personal history of urinary calculi: Secondary | ICD-10-CM | POA: Insufficient documentation

## 2019-08-16 DIAGNOSIS — D509 Iron deficiency anemia, unspecified: Secondary | ICD-10-CM | POA: Insufficient documentation

## 2019-08-16 NOTE — Telephone Encounter (Signed)
Scheduled per 7/19 los. No avs or calendar needed to be printed. Pt is mychart active.

## 2019-08-16 NOTE — Progress Notes (Signed)
  [place] OFFICE PROGRESS NOTE   Diagnosis: Carcinoid tumor  INTERVAL HISTORY:   Mr. Glazer returns for a scheduled visit.  He underwent coronary artery bypass surgery last month.  He had chest pain and dyspnea with minimal exertion prior to surgery.  This has improved.  He reports approximately 3 bowel movements per day since undergoing colon surgery.  No diarrhea.  No flushing.  Good appetite.  He was diagnosed with COVID-19 in February.  This was after the first dose of the Covid vaccine.  He reports being diagnosed with iron deficiency anemia and a Hemoccult positive stool this spring.  He was referred to gastroenterology, but has not been seen yet. Objective:  Vital signs in last 24 hours:  Blood pressure 108/73, pulse 71, temperature 97.6 F (36.4 C), temperature source Temporal, resp. rate 18, height 5\' 10"  (1.778 m), weight 184 lb 12.8 oz (83.8 kg), SpO2 99 %.    Lymphatics: No cervical, supraclavicular, axillary, or inguinal nodes Resp: Decreased breath sounds at the right posterior base, no respiratory distress Cardio: Regular rate and rhythm GI: Nontender, no mass, no hepatosplenomegaly Vascular: No leg edema   Lab Results:  Lab Results  Component Value Date   WBC 5.5 06/25/2018   HGB 12.6 (L) 06/25/2018   HCT 39.9 06/25/2018   MCV 88.9 06/25/2018   PLT 190 06/25/2018   NEUTROABS 3.2 01/09/2018    CMP  Lab Results  Component Value Date   NA 144 06/25/2018   K 4.1 06/25/2018   CL 107 06/25/2018   CO2 29 06/25/2018   GLUCOSE 141 (H) 06/25/2018   BUN 19 06/25/2018   CREATININE 1.14 06/25/2018   CALCIUM 9.4 06/25/2018   PROT 5.5 (L) 01/07/2018   ALBUMIN 3.3 (L) 01/07/2018   AST 20 01/07/2018   ALT 21 01/07/2018   ALKPHOS 45 01/07/2018   BILITOT 0.7 01/07/2018   GFRNONAA >60 06/25/2018   GFRAA >60 06/25/2018    Medications: I have reviewed the patient's current medications.   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 Admission 01/06/2018 with a small bowel obstruction secondary to #1 3. Coronary artery disease, status post coronary artery bypass surgery June 2021 4. Diabetes 5. Hypertension 6. History of kidney stones 7. Macular degeneration 8. Iron deficiency anemia February 2021, stool Hemoccult positive per patient report 9. COVID-19 infection February 2021    Disposition: Mr. Bunte is in clinical remission from the small bowel carcinoid tumor.  We will follow up on the chromogranin A level from today.  He will return for an office visit and chromogranin a level in 8 months.  He has multiple comorbid conditions including diabetes and coronary artery disease.  He reports he is scheduled to begin cardiac rehab following recent bypass surgery.  I recommend he follow-up with his gastroenterologist for evaluation of the Hemoccult positive stool and iron deficiency.  Betsy Coder, MD  08/16/2019  11:54 AM

## 2019-08-17 LAB — CHROMOGRANIN A: Chromogranin A (ng/mL): 89.4 ng/mL (ref 0.0–101.8)

## 2020-04-13 ENCOUNTER — Inpatient Hospital Stay: Payer: Medicare HMO

## 2020-04-13 ENCOUNTER — Other Ambulatory Visit: Payer: Self-pay

## 2020-04-13 ENCOUNTER — Inpatient Hospital Stay: Payer: Medicare HMO | Attending: Oncology | Admitting: Oncology

## 2020-04-13 VITALS — BP 155/87 | HR 71 | Temp 97.6°F | Resp 18 | Ht 70.0 in | Wt 189.9 lb

## 2020-04-13 DIAGNOSIS — Z85060 Personal history of malignant carcinoid tumor of small intestine: Secondary | ICD-10-CM | POA: Insufficient documentation

## 2020-04-13 DIAGNOSIS — E119 Type 2 diabetes mellitus without complications: Secondary | ICD-10-CM | POA: Insufficient documentation

## 2020-04-13 DIAGNOSIS — C7A012 Malignant carcinoid tumor of the ileum: Secondary | ICD-10-CM

## 2020-04-13 DIAGNOSIS — Z8673 Personal history of transient ischemic attack (TIA), and cerebral infarction without residual deficits: Secondary | ICD-10-CM | POA: Diagnosis not present

## 2020-04-13 DIAGNOSIS — I10 Essential (primary) hypertension: Secondary | ICD-10-CM | POA: Insufficient documentation

## 2020-04-13 DIAGNOSIS — I2510 Atherosclerotic heart disease of native coronary artery without angina pectoris: Secondary | ICD-10-CM | POA: Insufficient documentation

## 2020-04-13 NOTE — Progress Notes (Signed)
  [facility] OFFICE PROGRESS NOTE   Diagnosis: Carcinoid tumor  INTERVAL HISTORY:   [name] returns for a scheduled visit.  He was admitted 04/09/2020 with nausea/vomiting and diarrhea.  He was diagnosed with C. difficile colitis and he is taking vancomycin.  Diarrhea has improved.  He has chronic frequent bowel movements following abdominal surgery.  Prior to last week there was no increase in diarrhea.  He is scheduled for colonoscopy next month.  Objective:  Vital signs in last 24 hours:  Blood pressure (!) 155/87, pulse 71, temperature 97.6 F (36.4 C), temperature source Tympanic, resp. rate 18, height 5\' 10"  (1.778 m), weight 189 lb 14.4 oz (86.1 kg), SpO2 96 %.    Lymphatics: No cervical, supraclavicular, axillary, or inguinal nodes Resp: Lungs clear bilaterally Cardio: Regular rate and rhythm GI: Nontender, no mass, no hepatosplenomegaly Vascular: No leg edema    Portacath/PICC-without erythema  Lab Results:  Lab Results  Component Value Date   WBC 5.5 06/25/2018   HGB 12.6 (L) 06/25/2018   HCT 39.9 06/25/2018   MCV 88.9 06/25/2018   PLT 190 06/25/2018   NEUTROABS 3.2 01/09/2018    CMP  Lab Results  Component Value Date   NA 144 06/25/2018   K 4.1 06/25/2018   CL 107 06/25/2018   CO2 29 06/25/2018   GLUCOSE 141 (H) 06/25/2018   BUN 19 06/25/2018   CREATININE 1.14 06/25/2018   CALCIUM 9.4 06/25/2018   PROT 5.5 (L) 01/07/2018   ALBUMIN 3.3 (L) 01/07/2018   AST 20 01/07/2018   ALT 21 01/07/2018   ALKPHOS 45 01/07/2018   BILITOT 0.7 01/07/2018   GFRNONAA >60 06/25/2018   GFRAA >60 06/25/2018   Chromogranin A on 08/16/2019: 89.4  Medications: I have reviewed the patient's current medications.   Assessment/Plan: 1. Carcinoid tumor of the terminal ileum, status post an ileocecectomy 01/12/2018  pT2pN0  Lymphovascular invasion present  No evidence of metastatic disease on a CT abdomen/pelvis 01/06/2018  CT abdomen/pelvis  04/09/2020 (admitted with C. difficile colitis and rotavirus enteritis)-air-fluid levels throughout the small and large bowel, mildly dilated small bowel, postsurgical changes in the stomach and right bowel, nonobstructing right renal calculi 2. Admission 01/06/2018 with a small bowel obstruction secondary to #1 3. Coronary artery disease, status post coronary artery bypass surgery June 2021 4. Diabetes 5. Hypertension 6. History of kidney stones 7. Macular degeneration 8. Iron deficiency anemia February 2021, stool Hemoccult positive per patient report, scheduled for a colonoscopy April 2022 9. COVID-19 infection February 2021 10. Admission with C. difficile colitis and rotovirus enteritis ([facility]) 04/09/2020 11. TIA 02/24/2019      Disposition: [name] is in clinical remission from the carcinoid tumor.  We will follow up on the chromogranin A level from today.  He will return for an office visit and chromogranin a level in 9 months.  He is scheduled for a colonoscopy next month.  He will follow-up with [place] primary provider after being diagnosed with C. difficile colitis.  Betsy Coder, MD  04/13/2020  9:51 AM

## 2020-04-15 LAB — CHROMOGRANIN A: Chromogranin A (ng/mL): 143.6 ng/mL — ABNORMAL HIGH (ref 0.0–101.8)

## 2020-04-18 ENCOUNTER — Telehealth: Payer: Self-pay | Admitting: *Deleted

## 2020-04-18 DIAGNOSIS — C7A012 Malignant carcinoid tumor of the ileum: Secondary | ICD-10-CM

## 2020-04-18 NOTE — Telephone Encounter (Signed)
-----   Message from Lance Pier, MD sent at 04/17/2020  6:47 PM EDT ----- Please call patient, chromogranin is mildly elevated, could be related to recent c.diff infection, repeat in 4 months

## 2020-04-18 NOTE — Telephone Encounter (Signed)
Notified of elevation in chromogranin A. MD feels it could be related to his recent c. Diff infection. Will repeat in 4 months. Patient agrees. Scheduling message sent.

## 2020-04-19 ENCOUNTER — Telehealth: Payer: Self-pay | Admitting: Oncology

## 2020-04-19 NOTE — Telephone Encounter (Signed)
Scheduled appt per 3/22 sch msg. Pt aware.  

## 2020-08-14 ENCOUNTER — Other Ambulatory Visit: Payer: Self-pay

## 2020-08-14 ENCOUNTER — Inpatient Hospital Stay: Payer: Medicare HMO | Attending: Oncology

## 2020-08-14 DIAGNOSIS — C7A012 Malignant carcinoid tumor of the ileum: Secondary | ICD-10-CM | POA: Insufficient documentation

## 2020-08-15 LAB — CHROMOGRANIN A: Chromogranin A (ng/mL): 105.4 ng/mL — ABNORMAL HIGH (ref 0.0–101.8)

## 2020-08-16 ENCOUNTER — Telehealth: Payer: Self-pay

## 2020-08-16 NOTE — Telephone Encounter (Signed)
Pt informed of Cromogranin A results and to follow up as scheduled. Pt verbalized understanding. No further problems or concerns noted.

## 2020-08-16 NOTE — Telephone Encounter (Signed)
-----   Message from Lance Pier, MD sent at 08/15/2020  8:00 PM EDT ----- Please call patient,chromogranin A is lower, f/u as scheduled

## 2021-01-11 ENCOUNTER — Inpatient Hospital Stay: Payer: Medicare HMO

## 2021-01-11 ENCOUNTER — Other Ambulatory Visit: Payer: Self-pay

## 2021-01-11 ENCOUNTER — Inpatient Hospital Stay: Payer: Medicare HMO | Attending: Oncology | Admitting: Oncology

## 2021-01-11 VITALS — BP 145/82 | HR 88 | Temp 98.1°F | Resp 20 | Ht 70.0 in | Wt 192.4 lb

## 2021-01-11 DIAGNOSIS — Z8616 Personal history of COVID-19: Secondary | ICD-10-CM | POA: Diagnosis not present

## 2021-01-11 DIAGNOSIS — C7A012 Malignant carcinoid tumor of the ileum: Secondary | ICD-10-CM | POA: Diagnosis not present

## 2021-01-11 NOTE — Progress Notes (Signed)
°  [place] OFFICE PROGRESS NOTE   Diagnosis: Carcinoid tumor  INTERVAL HISTORY:   [name] returns as scheduled.  He complains of malaise and exertional dyspnea.  He had COVID-19 approximately 1 month ago.  No fever, flushing, or diarrhea.  He has 1-2 bowel movements per day.  This has been a pattern since he underwent bowel surgery.  Objective:  Vital signs in last 24 hours:  Blood pressure (!) 145/82, pulse 88, temperature 98.1 F (36.7 C), temperature source Oral, resp. rate 20, height 5\' 10"  (1.778 m), weight 192 lb 6.4 oz (87.3 kg), SpO2 100 %.    Lymphatics: No cervical, supraclavicular, axillary, or inguinal nodes Resp: Lungs clear bilaterally Cardio: Regular rate and rhythm GI: No hepatosplenomegaly, no mass, nontender Vascular: No leg edema  Lab Results:  Lab Results  Component Value Date   WBC 5.5 06/25/2018   HGB 12.6 (L) 06/25/2018   HCT 39.9 06/25/2018   MCV 88.9 06/25/2018   PLT 190 06/25/2018   NEUTROABS 3.2 01/09/2018    CMP  Lab Results  Component Value Date   NA 144 06/25/2018   K 4.1 06/25/2018   CL 107 06/25/2018   CO2 29 06/25/2018   GLUCOSE 141 (H) 06/25/2018   BUN 19 06/25/2018   CREATININE 1.14 06/25/2018   CALCIUM 9.4 06/25/2018   PROT 5.5 (L) 01/07/2018   ALBUMIN 3.3 (L) 01/07/2018   AST 20 01/07/2018   ALT 21 01/07/2018   ALKPHOS 45 01/07/2018   BILITOT 0.7 01/07/2018   GFRNONAA >60 06/25/2018   GFRAA >60 06/25/2018    Medications: I have reviewed the patient's current medications.   Assessment/Plan: Carcinoid tumor of the terminal ileum, status post an ileocecectomy 01/12/2018 pT2pN0 Lymphovascular invasion present No evidence of metastatic disease on a CT abdomen/pelvis 01/06/2018 CT abdomen/pelvis 04/09/2020 (admitted with C. difficile colitis and rotavirus enteritis)-air-fluid levels throughout the small and large bowel, mildly dilated small bowel, postsurgical changes in the stomach and right bowel,  nonobstructing right renal calculi Admission 01/06/2018 with a small bowel obstruction secondary to #1 Coronary artery disease, status post coronary artery bypass surgery June 2021 Diabetes Hypertension History of kidney stones Macular degeneration Iron deficiency anemia February 2021, stool Hemoccult positive per patient report, scheduled for a colonoscopy April 2022 COVID-19 infection February 2021 Admission with C. difficile colitis and rotovirus enteritis ([facility]) 04/09/2020 TIA 02/24/2019   Disposition: Mr. Ndiaye remains in clinical remission from the carcinoid tumor.  We will follow-up on the chromogranin a level from today.  He will return for an office visit and chromogranin a in 9 months. He will follow-up with his primary provider for evaluation of malaise and exertional dyspnea.  Betsy Coder, MD  01/11/2021  10:29 AM

## 2021-01-12 LAB — CHROMOGRANIN A: Chromogranin A (ng/mL): 107.4 ng/mL — ABNORMAL HIGH (ref 0.0–101.8)

## 2021-02-26 IMAGING — RF CERVICAL SPINE - 2-3 VIEW
1 series · 6 of 6 positions shown · non-contrast
Comparison: None.

CLINICAL DATA: Cervical fusion at C5-6

EXAM:
DG C-ARM 61-120 MIN; CERVICAL SPINE - 2-3 VIEW

[Series 1: run · 6 of 6 slices shown]
[im 1/6]
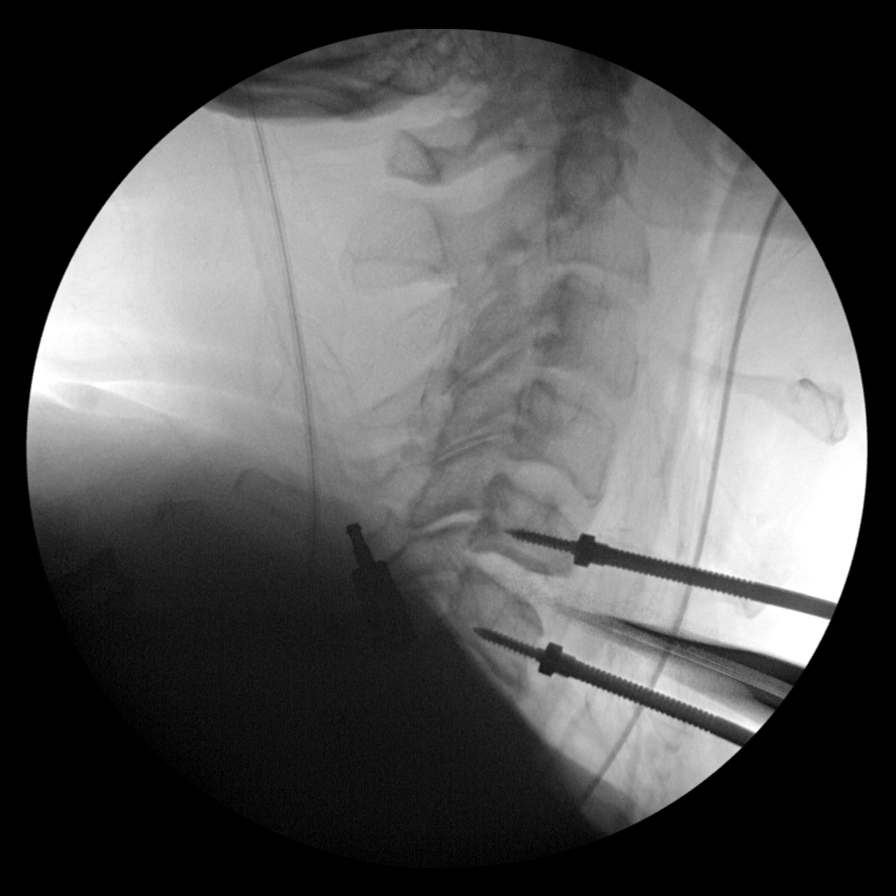
[im 2/6]
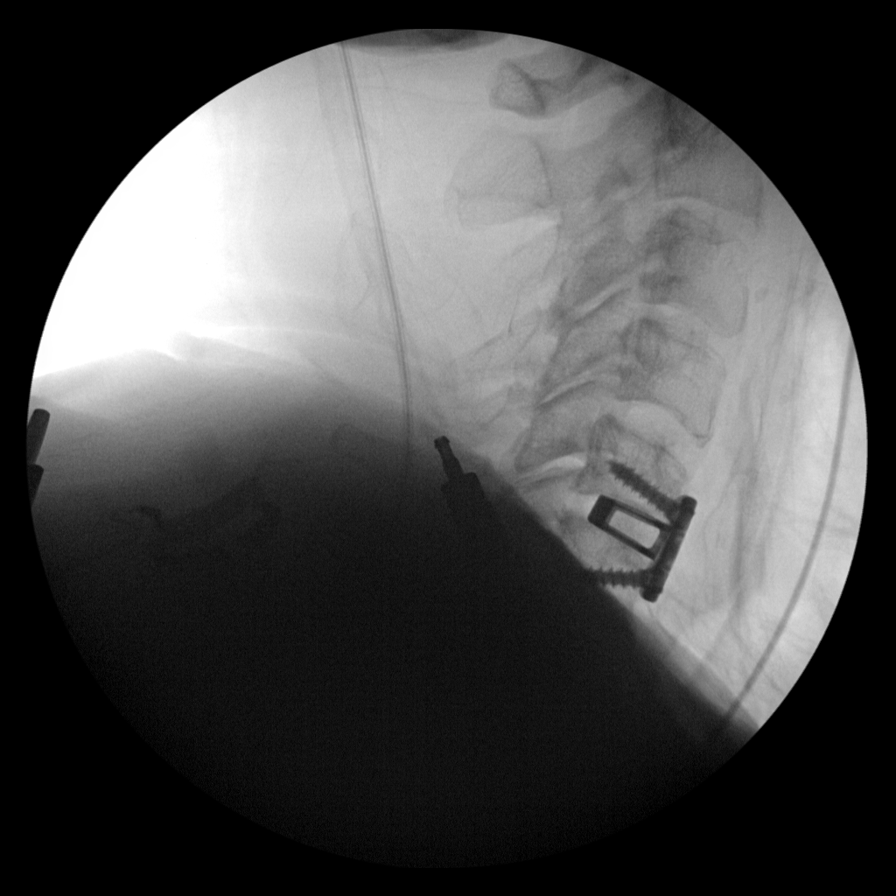
[im 3/6]
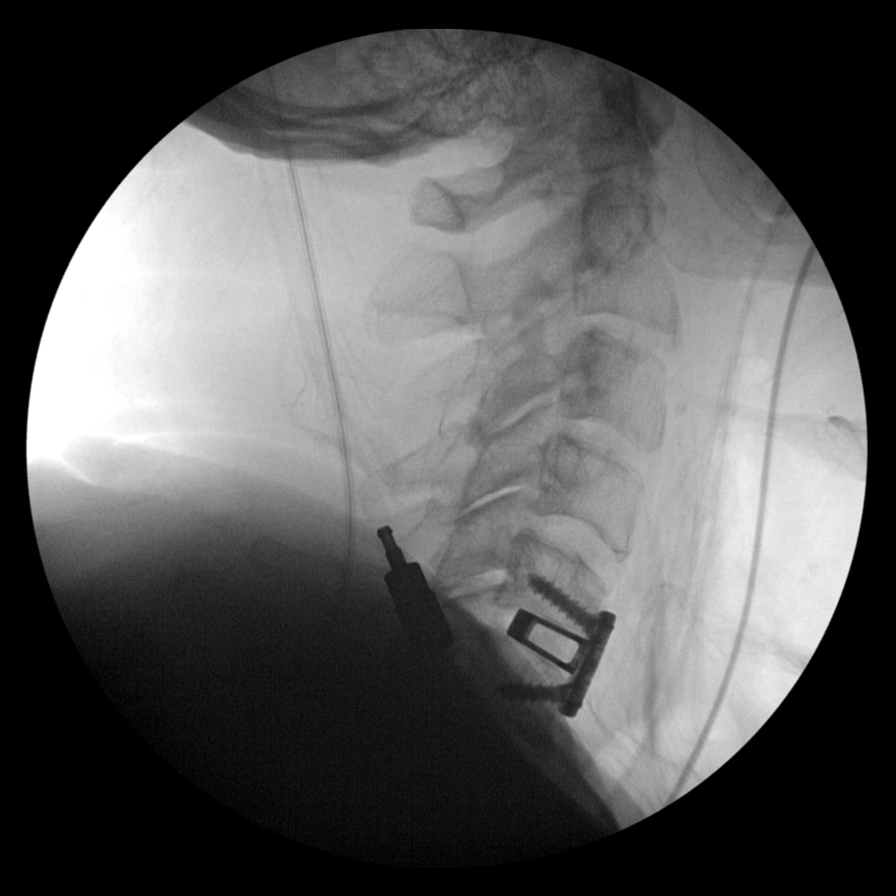
[im 4/6]
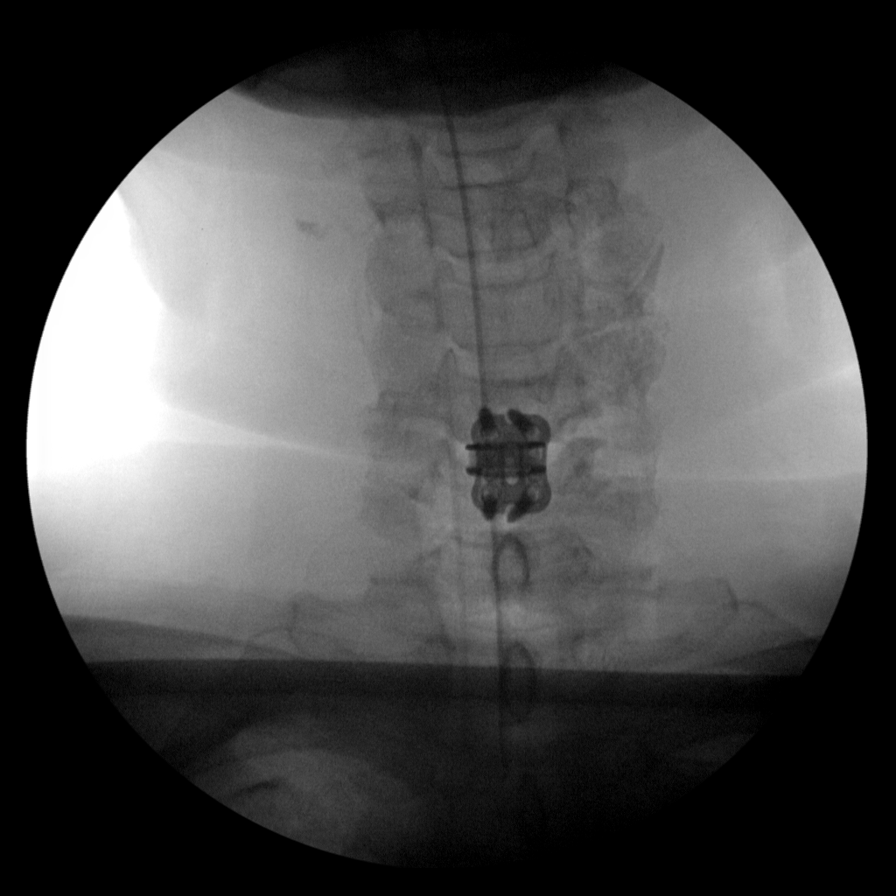
[im 5/6]
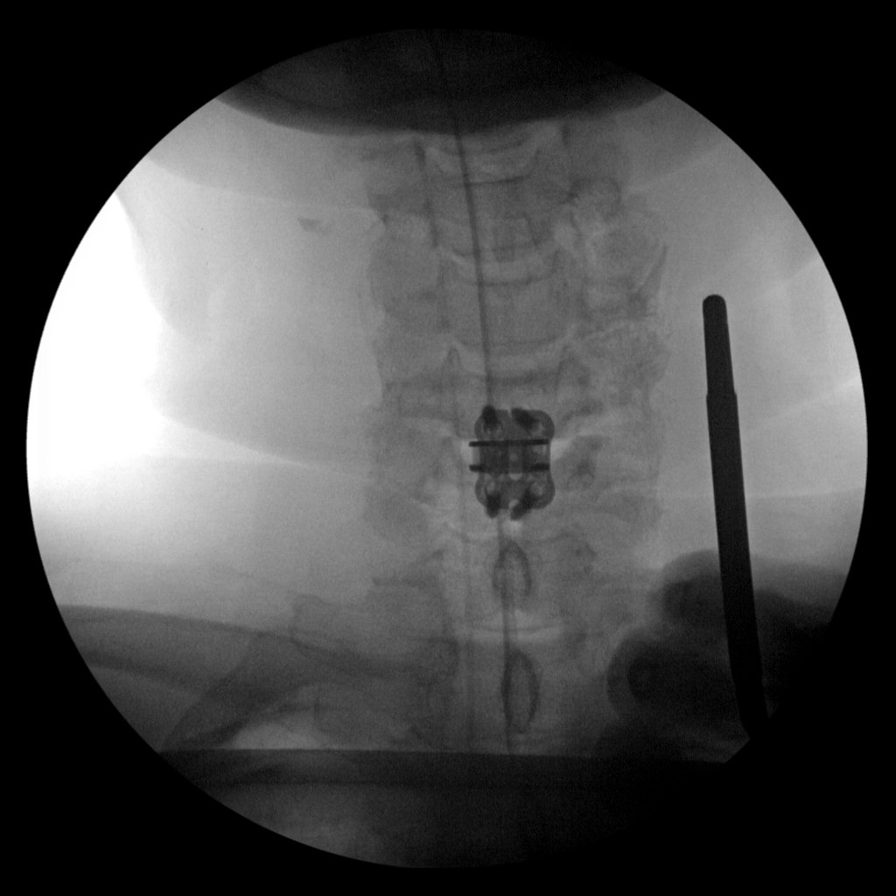
[im 6/6]
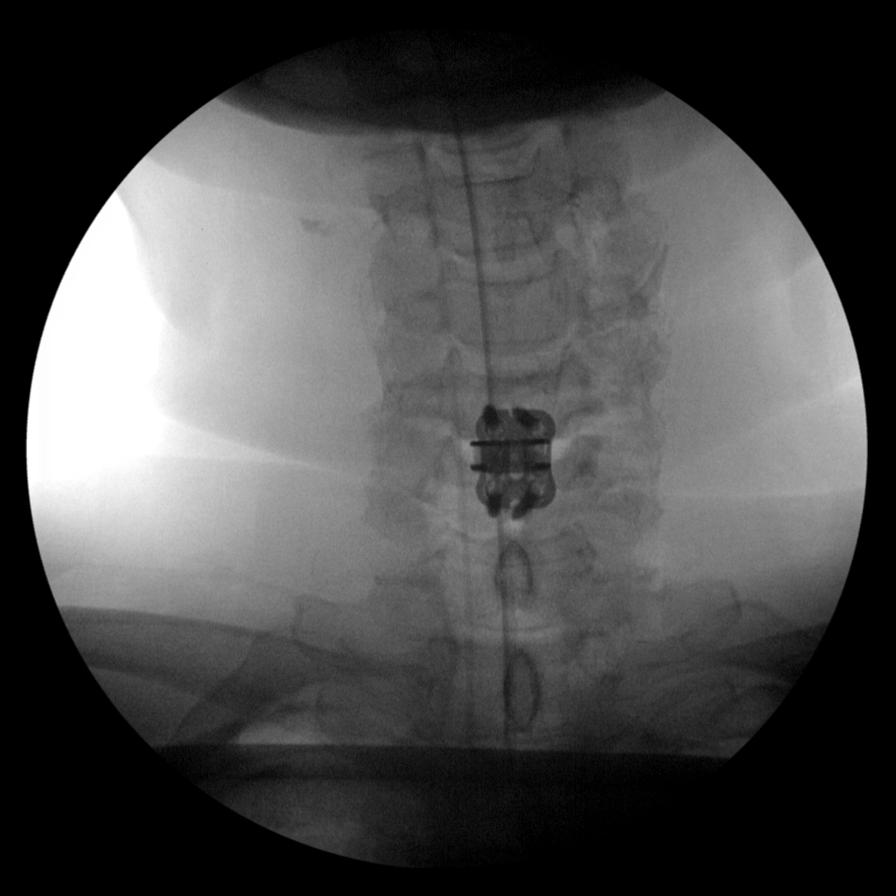

[6 of 6 positions shown; findings below may reference images not displayed]

FLUOROSCOPY TIME:  Radiation Exposure Index (as provided by the
fluoroscopic device): Not available

If the device does not provide the exposure index:

Fluoroscopy Time:  53 seconds

Number of Acquired Images:  6
FINDINGS: Initial images demonstrate screws at C5 and C6. Subsequent interbody
fusion with anterior fixation is noted. No soft tissue abnormality
is noted.
IMPRESSION: C5-6 fusion.

## 2021-10-08 ENCOUNTER — Other Ambulatory Visit: Payer: Medicare HMO

## 2021-10-08 ENCOUNTER — Ambulatory Visit: Payer: Medicare HMO | Admitting: Oncology

## 2021-11-05 ENCOUNTER — Inpatient Hospital Stay: Payer: Medicare HMO

## 2021-11-05 ENCOUNTER — Inpatient Hospital Stay: Payer: Medicare HMO | Admitting: Oncology

## 2021-11-09 ENCOUNTER — Inpatient Hospital Stay: Payer: Medicare Other | Admitting: Oncology

## 2021-11-09 ENCOUNTER — Inpatient Hospital Stay: Payer: Medicare Other

## 2021-11-12 ENCOUNTER — Inpatient Hospital Stay: Payer: Medicare Other

## 2021-11-12 ENCOUNTER — Inpatient Hospital Stay: Payer: Medicare Other | Attending: Oncology

## 2021-11-12 ENCOUNTER — Inpatient Hospital Stay: Payer: Medicare Other | Admitting: Oncology

## 2021-11-12 VITALS — BP 137/78 | HR 93 | Temp 98.1°F | Resp 18 | Ht 70.0 in | Wt 191.0 lb

## 2021-11-12 DIAGNOSIS — C7A012 Malignant carcinoid tumor of the ileum: Secondary | ICD-10-CM

## 2021-11-12 DIAGNOSIS — Z23 Encounter for immunization: Secondary | ICD-10-CM | POA: Insufficient documentation

## 2021-11-12 DIAGNOSIS — R0609 Other forms of dyspnea: Secondary | ICD-10-CM | POA: Insufficient documentation

## 2021-11-12 DIAGNOSIS — R197 Diarrhea, unspecified: Secondary | ICD-10-CM | POA: Diagnosis not present

## 2021-11-12 DIAGNOSIS — Z8616 Personal history of COVID-19: Secondary | ICD-10-CM | POA: Diagnosis not present

## 2021-11-12 MED ORDER — INFLUENZA VAC A&B SA ADJ QUAD 0.5 ML IM PRSY
0.5000 mL | PREFILLED_SYRINGE | Freq: Once | INTRAMUSCULAR | Status: AC
  Administered 2021-11-12: 0.5 mL via INTRAMUSCULAR
  Filled 2021-11-12: qty 0.5

## 2021-11-12 NOTE — Progress Notes (Signed)
  [place] OFFICE PROGRESS NOTE   Diagnosis: Carcinoid tumor  INTERVAL HISTORY:   [name] returns for a scheduled visit.  He continues to have exertional dyspnea.  He is being evaluated by cardiology.  There is chronic elevation of the right hemidiaphragm.  He has intermittent diarrhea since undergoing bowel surgery in 2019.  No fever or flushing.  Good appetite.  Objective:  Vital signs in last 24 hours:  Blood pressure 137/78, pulse 93, temperature 98.1 F (36.7 C), temperature source Oral, resp. rate 18, height '5\' 10"'$  (1.778 m), weight 191 lb (86.6 kg), SpO2 100 %.     Lymphatics: No cervical, supraclavicular, axillary, or inguinal nodes Resp: Lungs clear bilaterally, decrease in breath sounds at the right lower posterior chest, no respiratory distress Cardio: Regular rate and rhythm GI: No hepatosplenomegaly, no mass, nontender Vascular: No leg edema  Lab Results:  Lab Results  Component Value Date   WBC 5.5 06/25/2018   HGB 12.6 (L) 06/25/2018   HCT 39.9 06/25/2018   MCV 88.9 06/25/2018   PLT 190 06/25/2018   NEUTROABS 3.2 01/09/2018    CMP  Lab Results  Component Value Date   NA 144 06/25/2018   K 4.1 06/25/2018   CL 107 06/25/2018   CO2 29 06/25/2018   GLUCOSE 141 (H) 06/25/2018   BUN 19 06/25/2018   CREATININE 1.14 06/25/2018   CALCIUM 9.4 06/25/2018   PROT 5.5 (L) 01/07/2018   ALBUMIN 3.3 (L) 01/07/2018   AST 20 01/07/2018   ALT 21 01/07/2018   ALKPHOS 45 01/07/2018   BILITOT 0.7 01/07/2018   GFRNONAA >60 06/25/2018   GFRAA >60 06/25/2018    Medications: I have reviewed the patient's current medications.   Assessment/Plan: Carcinoid tumor of the terminal ileum, status post an ileocecectomy 01/12/2018 pT2pN0 Lymphovascular invasion present No evidence of metastatic disease on a CT abdomen/pelvis 01/06/2018 CT abdomen/pelvis 04/09/2020 (admitted with C. difficile colitis and rotavirus enteritis)-air-fluid levels throughout the  small and large bowel, mildly dilated small bowel, postsurgical changes in the stomach and right bowel, nonobstructing right renal calculi Admission 01/06/2018 with a small bowel obstruction secondary to #1 Coronary artery disease, status post coronary artery bypass surgery June 2021 Diabetes Hypertension History of kidney stones Macular degeneration Iron deficiency anemia February 2021, stool Hemoccult positive per patient report, EGD and colonoscopy April 2022-negative upper endoscopy, multiple polyps removed-tubular adenomas and hyperplastic polyps COVID-19 infection February 2021 Admission with C. difficile colitis and rotovirus enteritis ([facility]) 04/09/2020 TIA 02/24/2019     Disposition: [name] is in clinical remission from the carcinoid tumor.  The intermittent diarrhea is likely related to bowel surgery.  The diarrhea could be related to iron or metformin therapy.  We will follow-up on the chromogranin A level from today.  He will return for an office visit in 9-10 months.  Betsy Coder, MD  11/12/2021  9:48 AM

## 2021-11-14 LAB — CHROMOGRANIN A: Chromogranin A (ng/mL): 108.3 ng/mL — ABNORMAL HIGH (ref 0.0–101.8)

## 2021-11-15 ENCOUNTER — Telehealth: Payer: Self-pay | Admitting: *Deleted

## 2021-11-15 NOTE — Telephone Encounter (Signed)
Notified of stable chromogranin test. F/U as scheduled.

## 2021-11-15 NOTE — Telephone Encounter (Signed)
-----   Message from Ladell Pier, MD sent at 11/14/2021  8:46 PM EDT ----- Please call patient, chromogranin is stable, f/u as scheduled

## 2022-09-09 ENCOUNTER — Inpatient Hospital Stay: Payer: Medicare Other | Admitting: Oncology

## 2022-09-09 ENCOUNTER — Inpatient Hospital Stay: Payer: Medicare Other

## 2022-09-17 ENCOUNTER — Inpatient Hospital Stay: Payer: HMO | Attending: Oncology

## 2022-09-17 ENCOUNTER — Inpatient Hospital Stay: Payer: HMO | Admitting: Oncology

## 2022-09-17 VITALS — BP 128/76 | HR 66 | Temp 98.2°F | Resp 18 | Ht 70.0 in | Wt 187.4 lb

## 2022-09-17 DIAGNOSIS — C7A012 Malignant carcinoid tumor of the ileum: Secondary | ICD-10-CM | POA: Diagnosis not present

## 2022-09-17 DIAGNOSIS — Z85030 Personal history of malignant carcinoid tumor of large intestine: Secondary | ICD-10-CM | POA: Diagnosis present

## 2022-09-17 NOTE — Progress Notes (Signed)
  [facility] OFFICE PROGRESS NOTE   Diagnosis: Carcinoid tumor  INTERVAL HISTORY:   [name] returns as scheduled.  He feels well.  Good appetite.  No fever or flushing.  He continues to have 3-4 loose stools per day.  These occur after eating.  No other complaint.  Objective:  Vital signs in last 24 hours:  Blood pressure 128/76, pulse 66, temperature 98.2 F (36.8 C), temperature source Oral, resp. rate 18, height 5\' 10"  (1.778 m), weight 187 lb 6.4 oz (85 kg), SpO2 100%.    Lymphatics: No cervical, supraclavicular, axillary, or inguinal nodes Resp: Lungs clear bilaterally Cardio: Regular rate and rhythm GI: No hepatosplenomegaly, no mass, nontender Vascular: No leg edema  Lab Results:  Lab Results  Component Value Date   WBC 5.5 06/25/2018   HGB 12.6 (L) 06/25/2018   HCT 39.9 06/25/2018   MCV 88.9 06/25/2018   PLT 190 06/25/2018   NEUTROABS 3.2 01/09/2018    CMP  Lab Results  Component Value Date   NA 144 06/25/2018   K 4.1 06/25/2018   CL 107 06/25/2018   CO2 29 06/25/2018   GLUCOSE 141 (H) 06/25/2018   BUN 19 06/25/2018   CREATININE 1.14 06/25/2018   CALCIUM 9.4 06/25/2018   PROT 5.5 (L) 01/07/2018   ALBUMIN 3.3 (L) 01/07/2018   AST 20 01/07/2018   ALT 21 01/07/2018   ALKPHOS 45 01/07/2018   BILITOT 0.7 01/07/2018   GFRNONAA >60 06/25/2018   GFRAA >60 06/25/2018     Medications: I have reviewed the patient's current medications.   Assessment/Plan: Carcinoid tumor of the terminal ileum, status post an ileocecectomy 01/12/2018 pT2pN0 Lymphovascular invasion present No evidence of metastatic disease on a CT abdomen/pelvis 01/06/2018 CT abdomen/pelvis 04/09/2020 (admitted with C. difficile colitis and rotavirus enteritis)-air-fluid levels throughout the small and large bowel, mildly dilated small bowel, postsurgical changes in the stomach and right bowel, nonobstructing right renal calculi Admission 01/06/2018 with a small bowel  obstruction secondary to #1 Coronary artery disease, status post coronary artery bypass surgery June 2021 Diabetes Hypertension History of kidney stones Macular degeneration Iron deficiency anemia February 2021, stool Hemoccult positive per patient report, EGD and colonoscopy April 2022-negative upper endoscopy, multiple polyps removed-tubular adenomas and hyperplastic polyps COVID-19 infection February 2021 Admission with C. difficile colitis and rotovirus enteritis ([facility]) 04/09/2020 TIA 02/24/2019     Disposition: [name] remains in clinical remission from the carcinoid tumor.  We will follow-up on the chromogranin a level from today.  He will return for an office visit and chromogranin a level in 1 year.  He will call in the interim for new symptoms.  We will schedule restaging CTs if there is a consistent rise in the chromogranin A level.  Thornton Papas, MD  09/17/2022  10:57 AM

## 2022-09-18 LAB — CHROMOGRANIN A: Chromogranin A (ng/mL): 108.6 ng/mL — ABNORMAL HIGH (ref 0.0–101.8)

## 2022-09-19 ENCOUNTER — Telehealth: Payer: Self-pay

## 2022-09-19 NOTE — Telephone Encounter (Signed)
-----   Message from Thornton Papas sent at 09/18/2022  8:51 PM EDT ----- Please call patient, chromagranin A is stable, f/u as scheduled

## 2022-09-19 NOTE — Telephone Encounter (Signed)
Patient gave verbal understanding and had no further questions or concerns  

## 2023-09-17 ENCOUNTER — Ambulatory Visit: Payer: HMO | Admitting: Oncology

## 2023-09-17 ENCOUNTER — Other Ambulatory Visit: Payer: Self-pay

## 2023-09-17 ENCOUNTER — Other Ambulatory Visit: Payer: HMO

## 2023-09-18 ENCOUNTER — Other Ambulatory Visit: Payer: Self-pay | Admitting: *Deleted

## 2023-09-18 DIAGNOSIS — C7A012 Malignant carcinoid tumor of the ileum: Secondary | ICD-10-CM

## 2023-09-19 ENCOUNTER — Inpatient Hospital Stay: Attending: Oncology

## 2023-09-19 ENCOUNTER — Inpatient Hospital Stay (HOSPITAL_BASED_OUTPATIENT_CLINIC_OR_DEPARTMENT_OTHER): Admitting: Oncology

## 2023-09-19 VITALS — BP 125/79 | HR 60 | Temp 97.8°F | Resp 18 | Ht 70.0 in | Wt 183.9 lb

## 2023-09-19 DIAGNOSIS — D509 Iron deficiency anemia, unspecified: Secondary | ICD-10-CM | POA: Diagnosis not present

## 2023-09-19 DIAGNOSIS — C7A012 Malignant carcinoid tumor of the ileum: Secondary | ICD-10-CM | POA: Diagnosis not present

## 2023-09-19 DIAGNOSIS — Z8673 Personal history of transient ischemic attack (TIA), and cerebral infarction without residual deficits: Secondary | ICD-10-CM | POA: Diagnosis not present

## 2023-09-19 DIAGNOSIS — E119 Type 2 diabetes mellitus without complications: Secondary | ICD-10-CM | POA: Diagnosis not present

## 2023-09-19 DIAGNOSIS — Z87442 Personal history of urinary calculi: Secondary | ICD-10-CM | POA: Diagnosis not present

## 2023-09-19 DIAGNOSIS — H3530 Unspecified macular degeneration: Secondary | ICD-10-CM | POA: Diagnosis not present

## 2023-09-19 DIAGNOSIS — Z951 Presence of aortocoronary bypass graft: Secondary | ICD-10-CM | POA: Insufficient documentation

## 2023-09-19 DIAGNOSIS — Z85030 Personal history of malignant carcinoid tumor of large intestine: Secondary | ICD-10-CM | POA: Diagnosis present

## 2023-09-19 DIAGNOSIS — I10 Essential (primary) hypertension: Secondary | ICD-10-CM | POA: Insufficient documentation

## 2023-09-19 NOTE — Progress Notes (Signed)
  [facility] OFFICE PROGRESS NOTE   Diagnosis: Carcinoid tumor  INTERVAL HISTORY:   Mr. Greth turns as scheduled.  He feels well.  He has occasional diarrhea after undergoing bowel surgery.  No consistent diarrhea.  He lost his taste after developing COVID-19 3 months ago.  Objective:  Vital signs in last 24 hours:  Blood pressure 125/79, pulse 60, temperature 97.8 F (36.6 C), temperature source Temporal, resp. rate 18, height 5' 10 (1.778 m), weight 183 lb 14.4 oz (83.4 kg), SpO2 100%.    Lymphatics: No cervical, supraclavicular, axillary, or inguinal nodes Resp: Lungs clear bilaterally Cardio: Regular rate and rhythm GI: No hepatosplenomegaly, nontender, no mass Vascular: No leg edema   Lab Results:  Lab Results  Component Value Date   WBC 5.5 06/25/2018   HGB 12.6 (L) 06/25/2018   HCT 39.9 06/25/2018   MCV 88.9 06/25/2018   PLT 190 06/25/2018   NEUTROABS 3.2 01/09/2018    CMP  Lab Results  Component Value Date   NA 144 06/25/2018   K 4.1 06/25/2018   CL 107 06/25/2018   CO2 29 06/25/2018   GLUCOSE 141 (H) 06/25/2018   BUN 19 06/25/2018   CREATININE 1.14 06/25/2018   CALCIUM 9.4 06/25/2018   PROT 5.5 (L) 01/07/2018   ALBUMIN  3.3 (L) 01/07/2018   AST 20 01/07/2018   ALT 21 01/07/2018   ALKPHOS 45 01/07/2018   BILITOT 0.7 01/07/2018   GFRNONAA >60 06/25/2018   GFRAA >60 06/25/2018    Medications: I have reviewed the patient's current medications.   Assessment/Plan: Carcinoid tumor of the terminal ileum, status post an ileocecectomy 01/12/2018 pT2pN0 Lymphovascular invasion present No evidence of metastatic disease on a CT abdomen/pelvis 01/06/2018 CT abdomen/pelvis 04/09/2020 (admitted with C. difficile colitis and rotavirus enteritis)-air-fluid levels throughout the small and large bowel, mildly dilated small bowel, postsurgical changes in the stomach and right bowel, nonobstructing right renal calculi Admission 01/06/2018 with a  small bowel obstruction secondary to #1 Coronary artery disease, status post coronary artery bypass surgery June 2021 Diabetes Hypertension History of kidney stones Macular degeneration Iron deficiency anemia February 2021, stool Hemoccult positive per patient report, EGD and colonoscopy April 2022-negative upper endoscopy, multiple polyps removed-tubular adenomas and hyperplastic polyps COVID-19 infection February 2021 Admission with C. difficile colitis and rotovirus enteritis ([facility]) 04/09/2020 TIA 02/24/2019     Disposition: Mr. Reffett is in clinical remission from the carcinoid tumor.  We will follow-up on the chromogranin A level from today.  He would like to continue follow-up in the oncology clinic.  He will return for an office visit in 1 year.  Arley Hof, MD  09/19/2023  12:13 PM

## 2023-09-22 ENCOUNTER — Ambulatory Visit: Payer: Self-pay | Admitting: Oncology

## 2023-09-22 ENCOUNTER — Encounter: Payer: Self-pay | Admitting: Oncology

## 2023-09-22 LAB — CHROMOGRANIN A: Chromogranin A (ng/mL): 134.2 ng/mL — ABNORMAL HIGH (ref 0.0–101.8)

## 2024-09-16 ENCOUNTER — Ambulatory Visit: Admitting: Oncology

## 2024-09-16 ENCOUNTER — Other Ambulatory Visit
